# Patient Record
Sex: Female | Born: 1960 | Race: Black or African American | Hispanic: No | Marital: Married | Smoking: Current every day smoker
Health system: Southern US, Community
[De-identification: ages and names within clinical notes are randomized; demographics above are authoritative.]

## PROBLEM LIST (undated history)

## (undated) DIAGNOSIS — R569 Unspecified convulsions: Secondary | ICD-10-CM

## (undated) DIAGNOSIS — R159 Full incontinence of feces: Secondary | ICD-10-CM

## (undated) DIAGNOSIS — I639 Cerebral infarction, unspecified: Secondary | ICD-10-CM

## (undated) DIAGNOSIS — H269 Unspecified cataract: Secondary | ICD-10-CM

## (undated) DIAGNOSIS — I10 Essential (primary) hypertension: Secondary | ICD-10-CM

## (undated) DIAGNOSIS — R32 Unspecified urinary incontinence: Secondary | ICD-10-CM

## (undated) DIAGNOSIS — G35 Multiple sclerosis: Secondary | ICD-10-CM

## (undated) DIAGNOSIS — F32A Depression, unspecified: Secondary | ICD-10-CM

## (undated) DIAGNOSIS — F329 Major depressive disorder, single episode, unspecified: Secondary | ICD-10-CM

## (undated) DIAGNOSIS — E7800 Pure hypercholesterolemia, unspecified: Secondary | ICD-10-CM

## (undated) HISTORY — DX: Unspecified cataract: H26.9

## (undated) HISTORY — PX: ECTOPIC PREGNANCY SURGERY: SHX613

## (undated) HISTORY — PX: FOOT FRACTURE SURGERY: SHX645

---

## 2002-10-13 ENCOUNTER — Ambulatory Visit (HOSPITAL_COMMUNITY): Admission: RE | Admit: 2002-10-13 | Discharge: 2002-10-13 | Payer: Self-pay | Admitting: General Surgery

## 2002-10-13 ENCOUNTER — Encounter: Payer: Self-pay | Admitting: General Surgery

## 2005-04-14 ENCOUNTER — Ambulatory Visit: Payer: Self-pay | Admitting: Family Medicine

## 2005-05-01 ENCOUNTER — Ambulatory Visit: Payer: Self-pay | Admitting: Family Medicine

## 2005-10-22 ENCOUNTER — Ambulatory Visit: Payer: Self-pay | Admitting: Family Medicine

## 2005-10-22 ENCOUNTER — Other Ambulatory Visit: Admission: RE | Admit: 2005-10-22 | Discharge: 2005-10-22 | Payer: Self-pay | Admitting: Family Medicine

## 2005-10-22 ENCOUNTER — Ambulatory Visit: Admission: AD | Admit: 2005-10-22 | Discharge: 2005-10-22 | Payer: Self-pay | Admitting: Family Medicine

## 2005-10-22 ENCOUNTER — Encounter (INDEPENDENT_AMBULATORY_CARE_PROVIDER_SITE_OTHER): Payer: Self-pay | Admitting: Specialist

## 2008-05-16 ENCOUNTER — Ambulatory Visit (HOSPITAL_COMMUNITY): Admission: RE | Admit: 2008-05-16 | Discharge: 2008-05-16 | Payer: Self-pay | Admitting: Internal Medicine

## 2008-09-11 ENCOUNTER — Emergency Department (HOSPITAL_COMMUNITY): Admission: EM | Admit: 2008-09-11 | Discharge: 2008-09-11 | Payer: Self-pay | Admitting: Emergency Medicine

## 2009-02-04 ENCOUNTER — Observation Stay (HOSPITAL_COMMUNITY): Admission: EM | Admit: 2009-02-04 | Discharge: 2009-02-05 | Payer: Self-pay | Admitting: Emergency Medicine

## 2009-02-04 ENCOUNTER — Ambulatory Visit: Payer: Self-pay | Admitting: Cardiology

## 2009-02-05 ENCOUNTER — Encounter: Payer: Self-pay | Admitting: Cardiology

## 2009-11-19 ENCOUNTER — Other Ambulatory Visit: Admission: RE | Admit: 2009-11-19 | Discharge: 2009-11-19 | Payer: Self-pay | Admitting: Internal Medicine

## 2010-02-17 ENCOUNTER — Ambulatory Visit (HOSPITAL_COMMUNITY): Admission: RE | Admit: 2010-02-17 | Discharge: 2010-02-17 | Payer: Self-pay | Admitting: Gastroenterology

## 2010-09-05 LAB — BASIC METABOLIC PANEL
CO2: 26 mEq/L (ref 19–32)
Chloride: 107 mEq/L (ref 96–112)
Creatinine, Ser: 0.53 mg/dL (ref 0.4–1.2)
GFR calc Af Amer: >60 mL/min (ref >60)
Glucose, Bld: 97 mg/dL (ref 70–99)
Sodium: 137 mEq/L (ref 135–145)

## 2010-09-05 LAB — CBC
Hemoglobin: 13.8 g/dL (ref 12.0–15.0)
MCHC: 34.1 g/dL (ref 30.0–36.0)
MCV: 93.9 fL (ref 78.0–100.0)
RBC: 4.32 MIL/uL (ref 3.87–5.11)
RDW: 13.8 % (ref 11.5–15.5)

## 2010-09-05 LAB — CARDIAC PANEL(CRET KIN+CKTOT+MB+TROPI)
Relative Index: INVALID (ref 0.0–2.5)
Relative Index: INVALID (ref 0.0–2.5)
Total CK: 53 U/L (ref 7–177)
Total CK: 71 U/L (ref 7–177)
Troponin I: 0.05 ng/mL (ref 0.00–0.06)
Troponin I: 0.06 ng/mL (ref 0.00–0.06)

## 2010-09-05 LAB — LIPID PANEL
Total CHOL/HDL Ratio: 3.6 RATIO
VLDL: 12 mg/dL (ref 0–40)

## 2010-09-05 LAB — HEPATIC FUNCTION PANEL
AST: 12 U/L (ref 0–37)
Bilirubin, Direct: <0.1 mg/dL (ref 0.0–0.3)
Total Bilirubin: 0.5 mg/dL (ref 0.3–1.2)

## 2010-09-05 LAB — POCT CARDIAC MARKERS
CKMB, poc: <1 ng/mL — ABNORMAL LOW (ref 1.0–8.0)
Myoglobin, poc: 25.2 ng/mL (ref 12–200)
Troponin i, poc: <0.05 ng/mL (ref 0.00–0.09)

## 2010-09-05 LAB — DIFFERENTIAL
Basophils Absolute: 0.1 10*3/uL (ref 0.0–0.1)
Basophils Relative: 1 % (ref 0–1)
Eosinophils Absolute: 0.2 10*3/uL (ref 0.0–0.7)
Monocytes Absolute: 0.4 10*3/uL (ref 0.1–1.0)
Monocytes Relative: 4 % (ref 3–12)

## 2010-10-17 NOTE — Op Note (Signed)
NAME:  Christy Joseph, Christy Joseph                ACCOUNT NO.:  [id number]   MEDICAL RECORD NO.:  [id number]          PATIENT TYPE:  AMB   LOCATION:  DFTL                          FACILITY:  [facility]   PHYSICIAN:  Tanya S. Shawnie Pons, M.D.   DATE OF BIRTH:  08/11/1960   DATE OF PROCEDURE:  10/22/2005  DATE OF DISCHARGE:                                 OPERATIVE REPORT   PREOPERATIVE DIAGNOSIS:  Ruptured ectopic.   POSTOPERATIVE DIAGNOSIS:  Ruptured ectopic.   PROCEDURE:  Laparoscopic right partial salpingectomy.   SURGEON:  Shelbie Proctor. Shawnie Pons, M.D.   ANESTHESIA:  General endotracheal tube and local.   ANESTHESIOLOGIST:  Germaine Pomfret, M.D.   FINDINGS:  One liter of blood in the abdomen, ruptured bleeding right tube.   ESTIMATED BLOOD LOSS:  350 mL plus 1 L already in the abdomen.   COMPLICATIONS:  None.   SPECIMENS:  Right tube and contents to Pathology.   REASON FOR PROCEDURE:  The patient is a 50 year old gravida 5, para 0, who  has had 2 previous ectopics, who was seen in Dr. Nicholos Johns Rice's office  today with bleeding and lower abdominal pain.  The pain started  approximately 3 a.m.  She came into the [facility] and was found to  have a 6-week 1-day living right ectopic with complex free fluid in the  abdomen consistent with hemorrhage; she was taken to the operating room.   DESCRIPTION OF PROCEDURE:  The patient was taken to the OR, where she was  placed in the dorsal lithotomy and Allen stirrups.  Once anesthesia was  induced, she was prepped and draped in the usual sterile fashion.  A  speculum was then placed inside the vagina.  The cervix was visualized and  grasped on the anterior lip with a single-tooth tenaculum and a Hulka  tenaculum placed through the cervix.  A red rubber catheter was used to  drain the bladder.  Attention was then turned to the abdomen.  Four  milliliters of 0.25% Marcaine were then injected in the umbilicus.  A knife  was used to make a vertical incision  in the umbilicus.  A hemostat was used  to enter the peritoneal cavity.  Two 0 Vicryls on a UR6s were used to tag  the outer side of the fascia and a Hasson trocar was placed through the  incision.  Pneumoperitoneum was created.  There was immediately blood noted  in the pelvis.  The uterus was lifted up.  It appeared to be in the right  side; the left tube appeared normal.  Right and left lower quadrant 5-mm  ports were then placed under direct visualization through old scars.  The  tube was then grasped and the distal portion of the tube was taken off using  the Gyrus with excellent hemostasis noted.  The 10-mm camera was then  changed to a 5-mm and an Endobag was used to collect the specimen.  The  specimen was brought out through the umbilicus and the port was removed.  Once the specimen was out, the camera was changed back to the  10-mm and the  ports were reintroduced.  A Nezhat was then used to clear the abdomen of  most of the blood that was within it.  Some fluid was left in the abdominal  cavity to help break up whatever clot could not be removed.  All instruments  were then removed under direct visualization.  The ports were removed.  There appeared to be no significant bleeding.  The umbilicus was closed with  2 figures-of-eight, completing the previous-mentioned Vicryls on UR6.  The  skin was closed with a 3-0 Vicryl in a running subcuticular fashion.  The  two 5-mm ports were closed with a running subcuticular stitch of 3-0 Vicryl.  The Hulka tenaculum was then removed from the vagina.  There appeared to be  no significant bleeding.  The patient was taken out of dorsal lithotomy and  was stable  All instrument, needle and lap counts were correct x2.  The  patient was taken to Recovery in good condition.           ______________________________  Shelbie Proctor Shawnie Pons, M.D.     TSP/MEDQ  D:  10/22/2005  T:  10/23/2005  Job:  [id number]

## 2010-11-10 ENCOUNTER — Emergency Department (HOSPITAL_COMMUNITY)
Admission: EM | Admit: 2010-11-10 | Discharge: 2010-11-10 | Disposition: A | Discharge status: Home / Self Care | Payer: Self-pay | Attending: Emergency Medicine | Admitting: Emergency Medicine

## 2010-11-10 DIAGNOSIS — I10 Essential (primary) hypertension: Secondary | ICD-10-CM | POA: Insufficient documentation

## 2010-11-10 DIAGNOSIS — IMO0002 Reserved for concepts with insufficient information to code with codable children: Secondary | ICD-10-CM | POA: Insufficient documentation

## 2010-11-10 DIAGNOSIS — F17200 Nicotine dependence, unspecified, uncomplicated: Secondary | ICD-10-CM | POA: Insufficient documentation

## 2010-11-10 DIAGNOSIS — X58XXXA Exposure to other specified factors, initial encounter: Secondary | ICD-10-CM | POA: Insufficient documentation

## 2010-11-10 LAB — CK: Total CK: 60 U/L (ref 7–177)

## 2010-11-10 LAB — BASIC METABOLIC PANEL WITH GFR
CO2: 26 meq/L (ref 19–32)
Calcium: 10.8 mg/dL — ABNORMAL HIGH (ref 8.4–10.5)
Chloride: 98 meq/L (ref 96–112)
Creatinine, Ser: 0.69 mg/dL (ref 0.4–1.2)
Glucose, Bld: 119 mg/dL — ABNORMAL HIGH (ref 70–99)
Sodium: 134 meq/L — ABNORMAL LOW (ref 135–145)

## 2010-11-10 LAB — BASIC METABOLIC PANEL
BUN: 20 mg/dL (ref 6–23)
GFR calc Af Amer: >60 mL/min (ref >60)
GFR calc non Af Amer: >60 mL/min (ref >60)
Potassium: 3.6 mEq/L (ref 3.5–5.1)

## 2012-01-08 ENCOUNTER — Encounter (HOSPITAL_COMMUNITY): Payer: Self-pay | Admitting: Emergency Medicine

## 2012-01-08 ENCOUNTER — Emergency Department (HOSPITAL_COMMUNITY): Payer: Self-pay

## 2012-01-08 ENCOUNTER — Emergency Department (HOSPITAL_COMMUNITY)
Admission: EM | Admit: 2012-01-08 | Discharge: 2012-01-08 | Disposition: A | Discharge status: Home / Self Care | Payer: Self-pay | Attending: Emergency Medicine | Admitting: Emergency Medicine

## 2012-01-08 DIAGNOSIS — I10 Essential (primary) hypertension: Secondary | ICD-10-CM | POA: Insufficient documentation

## 2012-01-08 DIAGNOSIS — G35 Multiple sclerosis: Secondary | ICD-10-CM | POA: Insufficient documentation

## 2012-01-08 DIAGNOSIS — R4182 Altered mental status, unspecified: Secondary | ICD-10-CM | POA: Insufficient documentation

## 2012-01-08 DIAGNOSIS — F17200 Nicotine dependence, unspecified, uncomplicated: Secondary | ICD-10-CM | POA: Insufficient documentation

## 2012-01-08 HISTORY — DX: Essential (primary) hypertension: I10

## 2012-01-08 HISTORY — DX: Multiple sclerosis: G35

## 2012-01-08 LAB — RAPID URINE DRUG SCREEN, HOSP PERFORMED
Cocaine: NOT DETECTED
Opiates: NOT DETECTED

## 2012-01-08 LAB — BASIC METABOLIC PANEL
GFR calc Af Amer: >90 mL/min (ref >90)
GFR calc non Af Amer: >90 mL/min (ref >90)
Potassium: 3.9 mEq/L (ref 3.5–5.1)
Sodium: 139 mEq/L (ref 135–145)

## 2012-01-08 LAB — URINE MICROSCOPIC-ADD ON

## 2012-01-08 LAB — URINALYSIS, ROUTINE W REFLEX MICROSCOPIC
Nitrite: NEGATIVE
Specific Gravity, Urine: 1.03 (ref 1.005–1.030)
Urobilinogen, UA: 0.2 mg/dL (ref 0.0–1.0)

## 2012-01-08 LAB — CBC WITH DIFFERENTIAL/PLATELET
Basophils Relative: 0 % (ref 0–1)
Eosinophils Absolute: 0.1 10*3/uL (ref 0.0–0.7)
Lymphs Abs: 2.3 10*3/uL (ref 0.7–4.0)
MCH: 31.8 pg (ref 26.0–34.0)
MCHC: 34.5 g/dL (ref 30.0–36.0)
Neutrophils Relative %: 67 % (ref 43–77)
Platelets: 264 10*3/uL (ref 150–400)
RBC: 4.43 MIL/uL (ref 3.87–5.11)

## 2012-01-08 MED ORDER — ACETAMINOPHEN 500 MG PO TABS
1000.0000 mg | ORAL_TABLET | Freq: Once | ORAL | Status: AC
  Administered 2012-01-08: 1000 mg via ORAL
  Filled 2012-01-08: qty 2

## 2012-01-08 NOTE — ED Provider Notes (Signed)
History     CSN: [id number]  Arrival date & time 01/08/12  4401   First MD Initiated Contact with Patient 01/08/12 [phone]      Chief Complaint  Patient presents with  . Altered Mental Status    (Consider location/radiation/quality/duration/timing/severity/associated sxs/prior treatment) HPI Comments: Pt's husband states she was lying in bed "shaking".  He tried calling her name and shaking her "but she wouldn't wake up".  Her eyes were open and she was drooling.  ? Seizure.  No prior h/o seizures.  She has MS and her MD in [place] started her on "shots" about 2 weeks ago.  She has no local PCP.  She did not bite her tongue and she was not incontinent.  At exam time she c/o diffuse headache but nothing else.  She and her husband feel that she is acting normally.  She has had no head trauma.  No fever or chills.  Denies drugs or alcohol.  She is not diabetic.  When she awakened today she wondered "who are all these people around me"?  The history is provided by the patient, the spouse and the EMS personnel. No language interpreter was used.    Past Medical History  Diagnosis Date  . Multiple sclerosis   . Hypertension     History reviewed. No pertinent past surgical history.  No family history on file.  History  Substance Use Topics  . Smoking status: Current Everyday Smoker -- 1.0 packs/day  . Smokeless tobacco: Not on file  . Alcohol Use: No    OB History    Grav Para Term Preterm Abortions TAB SAB Ect Mult Living                  Review of Systems  Constitutional: Negative for fever, chills and diaphoresis.  HENT: Negative for drooling and neck pain.   Eyes: Negative for visual disturbance.  Respiratory: Negative for chest tightness and shortness of breath.   Cardiovascular: Negative for chest pain.  Gastrointestinal: Negative for nausea, vomiting and diarrhea.  Genitourinary: Negative for dysuria, urgency, frequency, hematuria and difficulty urinating.  Skin:  Negative for color change and pallor.  Neurological: Positive for headaches. Negative for dizziness, syncope, facial asymmetry, weakness and light-headedness.  Psychiatric/Behavioral: Negative for confusion.  All other systems reviewed and are negative.    Allergies  Review of patient's allergies indicates no known allergies.  Home Medications   Current Outpatient Rx  Name Route Sig Dispense Refill  . GLATIRAMER ACETATE 20 MG/ML [place] KIT Subcutaneous Inject 20 mg into the skin daily.    Marland Kitchen LISINOPRIL 20 MG PO TABS Oral Take 20 mg by mouth every morning.    Marland Kitchen SERTRALINE HCL 50 MG PO TABS Oral Take 50 mg by mouth every morning.      BP 144/96  Pulse 55  Temp 98.1 F (36.7 C) (Oral)  Resp 16  Ht 5\' 4"  (1.626 m)  Wt 110 lb (49.896 kg)  BMI 18.88 kg/m2  SpO2 100%  Physical Exam  Nursing note and vitals reviewed. Constitutional: She is oriented to person, place, and time. She appears well-developed and well-nourished. She is cooperative.  Non-toxic appearance. She does not have a sickly appearance. She does not appear ill. No distress.  HENT:  Head: Normocephalic and atraumatic.  Eyes: EOM are normal. Pupils are equal, round, and reactive to light.  Neck: Normal range of motion.  Cardiovascular: Normal rate, regular rhythm, normal heart sounds and intact distal pulses.   Pulmonary/Chest: Effort  normal and breath sounds normal. No respiratory distress. She has no wheezes.  Abdominal: Soft. She exhibits no distension. There is no tenderness.  Musculoskeletal: Normal range of motion.  Neurological: She is alert and oriented to person, place, and time. She has normal strength. She displays no tremor. No cranial nerve deficit or sensory deficit. She displays a negative Romberg sign. She displays no seizure activity. Coordination normal. GCS eye subscore is 4. GCS verbal subscore is 5. GCS motor subscore is 6.       Pt is alert and oriented.  Skin: Skin is warm and dry. She is not  diaphoretic.  Psychiatric: She has a normal mood and affect. Judgment normal.    ED Course  Procedures (including critical care time)  Labs Reviewed  URINALYSIS, ROUTINE W REFLEX MICROSCOPIC - Abnormal; Notable for the following:    Hgb urine dipstick SMALL (*)     Protein, ur 30 (*)     Leukocytes, UA SMALL (*)     All other components within normal limits  URINE RAPID DRUG SCREEN (HOSP PERFORMED) - Abnormal; Notable for the following:    Tetrahydrocannabinol POSITIVE (*)     All other components within normal limits  URINE MICROSCOPIC-ADD ON - Abnormal; Notable for the following:    Squamous Epithelial / LPF FEW (*)     Bacteria, UA MANY (*)     All other components within normal limits  CBC WITH DIFFERENTIAL  BASIC METABOLIC PANEL  ETHANOL   Ct Head Wo Contrast  01/08/2012  *RADIOLOGY REPORT*  Clinical Data: Altered mental status.  Confusion.  CT HEAD WITHOUT CONTRAST  Technique:  Contiguous axial images were obtained from the base of the skull through the vertex without contrast.  Comparison: None.  Findings: No mass lesion, mass effect, midline shift, hydrocephalus, hemorrhage.  No territorial ischemia or acute infarction.  Small areas of low attenuation are present in the parietal lobes bilaterally adjacent to the atrium of the lateral ventricles, likely representing chronic ischemic change rather than small lacunar infarcts.  Intracranial atherosclerosis is noted. The mastoid air cells and visualized paranasal sinuses clear.  IMPRESSION:  1.  No acute intracranial abnormality. 2.  Small low attenuation areas in the parietal lobes likely represent chronic ischemic change.  Original Report Authenticated By: Andreas Newport, M.D.    Date: 01/08/2012  Rate: 55  Rhythm: sinus bradycardia  QRS Axis: normal  Intervals: normal  ST/T Wave abnormalities: normal  Conduction Disutrbances:none  Narrative Interpretation:   Old EKG Reviewed: unchanged    1. Altered mental status        MDM  Call your MD in [place] for appt.  ? Seizure.        Evalina Field, [place] 01/08/12 [phone]

## 2012-01-08 NOTE — ED Provider Notes (Signed)
Medical screening examination/treatment/procedure(s) were conducted as a shared visit with non-physician practitioner(s) and myself.  I personally evaluated the patient during the encounter.  Normal mentation and normal physical exam in ED. CT scan negative. Patient alert at discharge  Donnetta Hutching, MD 01/08/12 1400

## 2012-01-08 NOTE — ED Notes (Addendum)
Per ems-pt husband reports confusion upon waking this am. [id number]. Unsteady gait. Pt husband states pt was "shaking in the bed and she wouldn't wake up". No loss of bowel/bladder. Pt A and O x 4, neuro intact upon arrival to ed. Nad noted.

## 2012-02-16 ENCOUNTER — Other Ambulatory Visit (HOSPITAL_COMMUNITY): Payer: Self-pay | Admitting: Nurse Practitioner

## 2012-02-16 DIAGNOSIS — Z139 Encounter for screening, unspecified: Secondary | ICD-10-CM

## 2012-02-22 ENCOUNTER — Ambulatory Visit (HOSPITAL_COMMUNITY)
Admission: RE | Admit: 2012-02-22 | Discharge: 2012-02-22 | Disposition: A | Discharge status: Home / Self Care | Payer: PRIVATE HEALTH INSURANCE | Source: Ambulatory Visit | Attending: Family Medicine | Admitting: Family Medicine

## 2012-02-22 DIAGNOSIS — Z1231 Encounter for screening mammogram for malignant neoplasm of breast: Secondary | ICD-10-CM | POA: Insufficient documentation

## 2012-02-22 DIAGNOSIS — Z139 Encounter for screening, unspecified: Secondary | ICD-10-CM

## 2014-06-01 DIAGNOSIS — H269 Unspecified cataract: Secondary | ICD-10-CM

## 2014-06-01 HISTORY — DX: Unspecified cataract: H26.9

## 2014-06-01 HISTORY — PX: EYE SURGERY: SHX253

## 2014-12-19 ENCOUNTER — Other Ambulatory Visit: Payer: Self-pay

## 2014-12-19 ENCOUNTER — Encounter (HOSPITAL_COMMUNITY): Payer: Self-pay

## 2014-12-19 ENCOUNTER — Encounter (HOSPITAL_COMMUNITY)
Admission: RE | Admit: 2014-12-19 | Discharge: 2014-12-19 | Disposition: A | Discharge status: Home / Self Care | Payer: Medicaid Other | Source: Ambulatory Visit | Attending: Ophthalmology | Admitting: Ophthalmology

## 2014-12-19 DIAGNOSIS — Z01818 Encounter for other preprocedural examination: Secondary | ICD-10-CM | POA: Insufficient documentation

## 2014-12-19 HISTORY — DX: Major depressive disorder, single episode, unspecified: F32.9

## 2014-12-19 HISTORY — DX: Cerebral infarction, unspecified: I63.9

## 2014-12-19 HISTORY — DX: Full incontinence of feces: R15.9

## 2014-12-19 HISTORY — DX: Unspecified convulsions: R56.9

## 2014-12-19 HISTORY — DX: Unspecified urinary incontinence: R32

## 2014-12-19 HISTORY — DX: Depression, unspecified: F32.A

## 2014-12-19 HISTORY — DX: Pure hypercholesterolemia, unspecified: E78.00

## 2014-12-19 LAB — BASIC METABOLIC PANEL
Anion gap: 9 (ref 5–15)
BUN: 18 mg/dL (ref 6–20)
CALCIUM: 9.9 mg/dL (ref 8.9–10.3)
CHLORIDE: 106 mmol/L (ref 101–111)
CO2: 26 mmol/L (ref 22–32)
Creatinine, Ser: 0.94 mg/dL (ref 0.44–1.00)
GFR calc Af Amer: >60 mL/min (ref >60)
GLUCOSE: 90 mg/dL (ref 65–99)
Potassium: 4.3 mmol/L (ref 3.5–5.1)
Sodium: 141 mmol/L (ref 135–145)

## 2014-12-19 LAB — CBC WITH DIFFERENTIAL/PLATELET
BASOS ABS: 0 10*3/uL (ref 0.0–0.1)
BASOS PCT: 0 % (ref 0–1)
EOS PCT: 2 % (ref 0–5)
Eosinophils Absolute: 0.2 10*3/uL (ref 0.0–0.7)
HEMATOCRIT: 36.9 % (ref 36.0–46.0)
Hemoglobin: 12.4 g/dL (ref 12.0–15.0)
LYMPHS PCT: 38 % (ref 12–46)
Lymphs Abs: 3 10*3/uL (ref 0.7–4.0)
MCH: 31.9 pg (ref 26.0–34.0)
MCHC: 33.6 g/dL (ref 30.0–36.0)
MCV: 94.9 fL (ref 78.0–100.0)
MONO ABS: 0.3 10*3/uL (ref 0.1–1.0)
MONOS PCT: 4 % (ref 3–12)
NEUTROS ABS: 4.3 10*3/uL (ref 1.7–7.7)
NEUTROS PCT: 56 % (ref 43–77)
Platelets: 287 10*3/uL (ref 150–400)
RBC: 3.89 MIL/uL (ref 3.87–5.11)
RDW: 14.4 % (ref 11.5–15.5)
WBC: 7.7 10*3/uL (ref 4.0–10.5)

## 2014-12-19 NOTE — Pre-Procedure Instructions (Signed)
Patient assisted by sister, Cassell Clement. Christy Joseph asks [place] to contact her for any correspondence concerning change in therapy for patient. [phone].

## 2014-12-19 NOTE — Patient Instructions (Addendum)
Your procedure is scheduled on: 12/27/2014  Report to [facility] at  615    AM.  Call this number if you have problems the morning of surgery: [phone]   Do not eat food or drink liquids :After Midnight.      Take these medicines the morning of surgery with A SIP OF WATER: lisinopril, zoloft   Do not wear jewelry, make-up or nail polish.  Do not wear lotions, powders, or perfumes. You may wear deodorant.  Do not shave 48 hours prior to surgery.  Do not bring valuables to the hospital.  Contacts, dentures or bridgework may not be worn into surgery.  Leave suitcase in the car. After surgery it may be brought to your room.  For patients admitted to the hospital, checkout time is 11:00 AM the day of discharge.   Patients discharged the day of surgery will not be allowed to drive home.  :     Please read over the following fact sheets that you were given: Coughing and Deep Breathing, Surgical Site Infection Prevention, Anesthesia Post-op Instructions and Care and Recovery After Surgery    Cataract A cataract is a clouding of the lens of the eye. When a lens becomes cloudy, vision is reduced based on the degree and nature of the clouding. Many cataracts reduce vision to some degree. Some cataracts make people more near-sighted as they develop. Other cataracts increase glare. Cataracts that are ignored and become worse can sometimes look white. The white color can be seen through the pupil. CAUSES   Aging. However, cataracts may occur at any age, even in newborns.   Certain drugs.   Trauma to the eye.   Certain diseases such as diabetes.   Specific eye diseases such as chronic inflammation inside the eye or a sudden attack of a rare form of glaucoma.   Inherited or acquired medical problems.  SYMPTOMS   Gradual, progressive drop in vision in the affected eye.   Severe, rapid visual loss. This most often happens when trauma is the cause.  DIAGNOSIS  To detect a cataract, an eye  doctor examines the lens. Cataracts are best diagnosed with an exam of the eyes with the pupils enlarged (dilated) by drops.  TREATMENT  For an early cataract, vision may improve by using different eyeglasses or stronger lighting. If that does not help your vision, surgery is the only effective treatment. A cataract needs to be surgically removed when vision loss interferes with your everyday activities, such as driving, reading, or watching TV. A cataract may also have to be removed if it prevents examination or treatment of another eye problem. Surgery removes the cloudy lens and usually replaces it with a substitute lens (intraocular lens, IOL).  At a time when both you and your doctor agree, the cataract will be surgically removed. If you have cataracts in both eyes, only one is usually removed at a time. This allows the operated eye to heal and be out of danger from any possible problems after surgery (such as infection or poor wound healing). In rare cases, a cataract may be doing damage to your eye. In these cases, your caregiver may advise surgical removal right away. The vast majority of people who have cataract surgery have better vision afterward. HOME CARE INSTRUCTIONS  If you are not planning surgery, you may be asked to do the following:  Use different eyeglasses.   Use stronger or brighter lighting.   Ask your eye doctor about reducing your  medicine dose or changing medicines if it is thought that a medicine caused your cataract. Changing medicines does not make the cataract go away on its own.   Become familiar with your surroundings. Poor vision can lead to injury. Avoid bumping into things on the affected side. You are at a higher risk for tripping or falling.   Exercise extreme care when driving or operating machinery.   Wear sunglasses if you are sensitive to bright light or experiencing problems with glare.  SEEK IMMEDIATE MEDICAL CARE IF:   You have a worsening or sudden  vision loss.   You notice redness, swelling, or increasing pain in the eye.   You have a fever.  Document Released: 05/18/2005 Document Revised: 05/07/2011 Document Reviewed: 01/09/2011 [facility] Patient Information 2012 [place].PATIENT INSTRUCTIONS POST-ANESTHESIA  IMMEDIATELY FOLLOWING SURGERY:  Do not drive or operate machinery for the first twenty four hours after surgery.  Do not make any important decisions for twenty four hours after surgery or while taking narcotic pain medications or sedatives.  If you develop intractable nausea and vomiting or a severe headache please notify your doctor immediately.  FOLLOW-UP:  Please make an appointment with your surgeon as instructed. You do not need to follow up with anesthesia unless specifically instructed to do so.  WOUND CARE INSTRUCTIONS (if applicable):  Keep a dry clean dressing on the anesthesia/puncture wound site if there is drainage.  Once the wound has quit draining you may leave it open to air.  Generally you should leave the bandage intact for twenty four hours unless there is drainage.  If the epidural site drains for more than 36-48 hours please call the anesthesia department.  QUESTIONS?:  Please feel free to call your physician or the hospital operator if you have any questions, and they will be happy to assist you.

## 2014-12-19 NOTE — Pre-Procedure Instructions (Signed)
Patient given information to sign up for my chart at home. 

## 2014-12-27 ENCOUNTER — Ambulatory Visit (HOSPITAL_COMMUNITY): Payer: Medicaid Other | Admitting: Anesthesiology

## 2014-12-27 ENCOUNTER — Encounter (HOSPITAL_COMMUNITY)
Admission: RE | Disposition: A | Discharge status: Home / Self Care | Payer: Self-pay | Source: Ambulatory Visit | Attending: Ophthalmology

## 2014-12-27 ENCOUNTER — Ambulatory Visit (HOSPITAL_COMMUNITY)
Admission: RE | Admit: 2014-12-27 | Discharge: 2014-12-27 | Disposition: A | Discharge status: Home / Self Care | Payer: Medicaid Other | Source: Ambulatory Visit | Attending: Ophthalmology | Admitting: Ophthalmology

## 2014-12-27 ENCOUNTER — Encounter (HOSPITAL_COMMUNITY): Payer: Self-pay | Admitting: *Deleted

## 2014-12-27 DIAGNOSIS — I10 Essential (primary) hypertension: Secondary | ICD-10-CM | POA: Diagnosis not present

## 2014-12-27 DIAGNOSIS — Z8673 Personal history of transient ischemic attack (TIA), and cerebral infarction without residual deficits: Secondary | ICD-10-CM | POA: Diagnosis not present

## 2014-12-27 DIAGNOSIS — Z79899 Other long term (current) drug therapy: Secondary | ICD-10-CM | POA: Diagnosis not present

## 2014-12-27 DIAGNOSIS — F17200 Nicotine dependence, unspecified, uncomplicated: Secondary | ICD-10-CM | POA: Insufficient documentation

## 2014-12-27 DIAGNOSIS — G35 Multiple sclerosis: Secondary | ICD-10-CM | POA: Insufficient documentation

## 2014-12-27 DIAGNOSIS — H269 Unspecified cataract: Secondary | ICD-10-CM | POA: Insufficient documentation

## 2014-12-27 DIAGNOSIS — H21541 Posterior synechiae (iris), right eye: Secondary | ICD-10-CM | POA: Insufficient documentation

## 2014-12-27 HISTORY — PX: CATARACT EXTRACTION W/PHACO: SHX586

## 2014-12-27 SURGERY — PHACOEMULSIFICATION, CATARACT, WITH IOL INSERTION
Anesthesia: Monitor Anesthesia Care | Site: Eye | Laterality: Right

## 2014-12-27 MED ORDER — PHENYLEPHRINE HCL 2.5 % OP SOLN
OPHTHALMIC | Status: AC
  Filled 2014-12-27: qty 15

## 2014-12-27 MED ORDER — FENTANYL CITRATE (PF) 100 MCG/2ML IJ SOLN
INTRAMUSCULAR | Status: AC
  Filled 2014-12-27: qty 2

## 2014-12-27 MED ORDER — LIDOCAINE HCL (PF) 1 % IJ SOLN
INTRAMUSCULAR | Status: AC
  Filled 2014-12-27: qty 2

## 2014-12-27 MED ORDER — EPINEPHRINE HCL 1 MG/ML IJ SOLN
INTRAMUSCULAR | Status: AC
  Filled 2014-12-27: qty 1

## 2014-12-27 MED ORDER — TETRACAINE HCL 0.5 % OP SOLN
1.0000 [drp] | OPHTHALMIC | Status: AC
Start: 2014-12-27 — End: 2014-12-27
  Administered 2014-12-27 (×3): 1 [drp] via OPHTHALMIC
  Filled 2014-12-27: qty 2

## 2014-12-27 MED ORDER — BSS IO SOLN
INTRAOCULAR | Status: DC | PRN
  Administered 2014-12-27 (×2): 15 mL

## 2014-12-27 MED ORDER — TRYPAN BLUE 0.06 % OP SOLN
OPHTHALMIC | Status: AC
  Filled 2014-12-27: qty 0.5

## 2014-12-27 MED ORDER — PHENYLEPHRINE HCL 2.5 % OP SOLN
1.0000 [drp] | OPHTHALMIC | Status: AC
Start: 2014-12-27 — End: 2014-12-27
  Administered 2014-12-27 (×3): 1 [drp] via OPHTHALMIC

## 2014-12-27 MED ORDER — MIDAZOLAM HCL 2 MG/2ML IJ SOLN
1.0000 mg | INTRAMUSCULAR | Status: DC | PRN
  Administered 2014-12-27: 2 mg via INTRAVENOUS

## 2014-12-27 MED ORDER — MIDAZOLAM HCL 2 MG/2ML IJ SOLN
INTRAMUSCULAR | Status: AC
  Filled 2014-12-27: qty 2

## 2014-12-27 MED ORDER — LIDOCAINE HCL 3.5 % OP GEL
1.0000 "application " | Freq: Once | OPHTHALMIC | Status: AC
  Administered 2014-12-27: 1 via OPHTHALMIC
  Filled 2014-12-27: qty 1

## 2014-12-27 MED ORDER — CYCLOPENTOLATE-PHENYLEPHRINE 0.2-1 % OP SOLN
1.0000 [drp] | OPHTHALMIC | Status: AC
  Administered 2014-12-27 (×3): 1 [drp] via OPHTHALMIC

## 2014-12-27 MED ORDER — PROVISC 10 MG/ML IO SOLN
INTRAOCULAR | Status: DC | PRN
  Administered 2014-12-27: .85 mL via INTRAOCULAR

## 2014-12-27 MED ORDER — NEOMYCIN-POLYMYXIN-DEXAMETH 3.5-10000-0.1 OP SUSP
OPHTHALMIC | Status: AC
  Filled 2014-12-27: qty 5

## 2014-12-27 MED ORDER — POVIDONE-IODINE 5 % OP SOLN
OPHTHALMIC | Status: DC | PRN
  Administered 2014-12-27: 1 via OPHTHALMIC

## 2014-12-27 MED ORDER — CYCLOPENTOLATE-PHENYLEPHRINE OP SOLN OPTIME - NO CHARGE
OPHTHALMIC | Status: AC
Start: 2014-12-27 — End: 2014-12-27
  Filled 2014-12-27: qty 2

## 2014-12-27 MED ORDER — EPINEPHRINE HCL 1 MG/ML IJ SOLN
INTRAOCULAR | Status: DC | PRN
  Administered 2014-12-27: 500 mL

## 2014-12-27 MED ORDER — SODIUM HYALURONATE 10 MG/ML IO SOLN
INTRAOCULAR | Status: DC | PRN
  Administered 2014-12-27: 0.85 mL via INTRAOCULAR

## 2014-12-27 MED ORDER — LACTATED RINGERS IV SOLN
INTRAVENOUS | Status: DC
Start: 2014-12-27 — End: 2014-12-27
  Administered 2014-12-27: 1000 mL via INTRAVENOUS

## 2014-12-27 MED ORDER — FENTANYL CITRATE (PF) 100 MCG/2ML IJ SOLN
25.0000 ug | INTRAMUSCULAR | Status: AC
  Administered 2014-12-27 (×2): 25 ug via INTRAVENOUS

## 2014-12-27 MED ORDER — GLYCOPYRROLATE 0.2 MG/ML IJ SOLN
INTRAMUSCULAR | Status: DC | PRN
  Administered 2014-12-27: 0.2 mg via INTRAVENOUS

## 2014-12-27 MED ORDER — LIDOCAINE HCL (PF) 1 % IJ SOLN
INTRAOCULAR | Status: DC | PRN
  Administered 2014-12-27: 1 mL via OPHTHALMIC

## 2014-12-27 MED ORDER — TRYPAN BLUE 0.06 % OP SOLN
OPHTHALMIC | Status: DC | PRN
  Administered 2014-12-27: .25 mL via INTRAOCULAR

## 2014-12-27 MED ORDER — GLYCOPYRROLATE 0.2 MG/ML IJ SOLN
INTRAMUSCULAR | Status: AC
Start: 2014-12-27 — End: 2014-12-27
  Filled 2014-12-27: qty 1

## 2014-12-27 SURGICAL SUPPLY — 19 items
10-0 ETHILON SUTURE ×2 IMPLANT
CLOTH BEACON ORANGE TIMEOUT ST (SAFETY) ×2 IMPLANT
EYE SHIELD UNIVERSAL CLEAR (GAUZE/BANDAGES/DRESSINGS) ×3 IMPLANT
GLOVE BIOGEL PI IND STRL 6.5 (GLOVE) IMPLANT
GLOVE BIOGEL PI IND STRL 7.0 (GLOVE) IMPLANT
GLOVE BIOGEL PI IND STRL 7.5 (GLOVE) IMPLANT
GLOVE BIOGEL PI INDICATOR 6.5 (GLOVE)
GLOVE BIOGEL PI INDICATOR 7.0 (GLOVE) ×2
GLOVE BIOGEL PI INDICATOR 7.5 (GLOVE)
GLOVE EXAM NITRILE LRG STRL (GLOVE) IMPLANT
GLOVE EXAM NITRILE MD LF STRL (GLOVE) ×2 IMPLANT
PAD ARMBOARD 7.5X6 YLW CONV (MISCELLANEOUS) ×2 IMPLANT
RING MALYGIN (MISCELLANEOUS) ×2 IMPLANT
SIGHTPATH CAT PROC W REG LENS (Ophthalmic Related) ×2 IMPLANT
SYRINGE 1CC 25X5/8 TB ECLIPSE (MISCELLANEOUS) ×2 IMPLANT
TAPE SURG TRANSPORE 1 IN (GAUZE/BANDAGES/DRESSINGS) IMPLANT
TAPE SURGICAL TRANSPORE 1 IN (GAUZE/BANDAGES/DRESSINGS) ×2
VISCOELASTIC ADDITIONAL (OPHTHALMIC RELATED) ×3 IMPLANT
WATER STERILE IRR 250ML POUR (IV SOLUTION) ×3 IMPLANT

## 2014-12-27 NOTE — Anesthesia Preprocedure Evaluation (Signed)
Anesthesia Evaluation  Patient identified by MRN, date of birth, ID band Patient awake    Reviewed: Allergy & Precautions, NPO status , Patient's Chart, lab work & pertinent test results  Airway Mallampati: I  TM Distance: >3 FB     Dental  (+) Poor Dentition, Missing   Pulmonary Current Smoker,  breath sounds clear to auscultation        Cardiovascular hypertension, Pt. on medications Rhythm:Regular Rate:Normal     Neuro/Psych Seizures -,  PSYCHIATRIC DISORDERS Depression Multiple sclerosis CVA    GI/Hepatic negative GI ROS,   Endo/Other    Renal/GU      Musculoskeletal   Abdominal   Peds  Hematology   Anesthesia Other Findings   Reproductive/Obstetrics                             Anesthesia Physical Anesthesia Plan  ASA: III  Anesthesia Plan: MAC   Post-op Pain Management:    Induction:   Airway Management Planned:   Additional Equipment:   Intra-op Plan:   Post-operative Plan:   Informed Consent: I have reviewed the patients History and Physical, chart, labs and discussed the procedure including the risks, benefits and alternatives for the proposed anesthesia with the patient or authorized representative who has indicated his/her understanding and acceptance.     Plan Discussed with:   Anesthesia Plan Comments:         Anesthesia Quick Evaluation

## 2014-12-27 NOTE — Transfer of Care (Signed)
Immediate Anesthesia Transfer of Care Note  Patient: Christy Joseph  Procedure(s) Performed: Procedure(s) (LRB): CATARACT EXTRACTION PHACO AND INTRAOCULAR LENS PLACEMENT (IOC) (Right)  Patient Location: Shortstay  Anesthesia Type: MAC  Level of Consciousness: awake  Airway & Oxygen Therapy: Patient Spontanous Breathing   Post-op Assessment: Report given to PACU RN, Post -op Vital signs reviewed and stable and Patient moving all extremities  Post vital signs: Reviewed and stable  Complications: No apparent anesthesia complications

## 2014-12-27 NOTE — Discharge Instructions (Addendum)
Cataract Surgery °Care After °Refer to this sheet in the next few weeks. These instructions provide you with information on caring for yourself after your procedure. Your caregiver may also give you more specific instructions. Your treatment has been planned according to current medical practices, but problems sometimes occur. Call your caregiver if you have any problems or questions after your procedure.  °HOME CARE INSTRUCTIONS  °· Avoid strenuous activities as directed by your caregiver. °· Ask your caregiver when you can resume driving. °· Use eyedrops or other medicines to help healing and control pressure inside your eye as directed by your caregiver. °· Only take over-the-counter or prescription medicines for pain, discomfort, or fever as directed by your caregiver. °· Do not to touch or rub your eyes. °· You may be instructed to use a protective shield during the first few days and nights after surgery. If not, wear sunglasses to protect your eyes. This is to protect the eye from pressure or from being accidentally bumped. °· Keep the area around your eye clean and dry. Avoid swimming or allowing water to hit you directly in the face while showering. Keep soap and shampoo out of your eyes. °· Do not bend or lift heavy objects. Bending increases pressure in the eye. You can walk, climb stairs, and do light household chores. °· Do not put a contact lens into the eye that had surgery until your caregiver says it is okay to do so. °· Ask your doctor when you can return to work. This will depend on the kind of work that you do. If you work in a dusty environment, you may be advised to wear protective eyewear for a period of time. °· Ask your caregiver when it will be safe to engage in sexual activity. °· Continue with your regular eye exams as directed by your caregiver. °What to expect: °· It is normal to feel itching and mild discomfort for a few days after cataract surgery. Some fluid discharge is also common,  and your eye may be sensitive to light and touch. °· After 1 to 2 days, even moderate discomfort should disappear. In most cases, healing will take about 6 weeks. °· If you received an intraocular lens (IOL), you may notice that colors are very bright or have a blue tinge. Also, if you have been in bright sunlight, everything may appear reddish for a few hours. If you see these color tinges, it is because your lens is clear and no longer cloudy. Within a few months after receiving an IOL, these extra colors should go away. When you have healed, you will probably need new glasses. °SEEK MEDICAL CARE IF:  °· You have increased bruising around your eye. °· You have discomfort not helped by medicine. °SEEK IMMEDIATE MEDICAL CARE IF:  °· You have a  fever. °· You have a worsening or sudden vision loss. °· You have redness, swelling, or increasing pain in the eye. °· You have a thick discharge from the eye that had surgery. °MAKE SURE YOU: °· Understand these instructions. °· Will watch your condition. °· Will get help right away if you are not doing well or get worse. °Document Released: 12/05/2004 Document Revised: 08/10/2011 Document Reviewed: 01/09/2011 °[employer]® Patient Information ©2015 [employer]. This information is not intended to replace advice given to you by your health care provider. Make sure you discuss any questions you have with your health care provider. ° ° ° °PATIENT INSTRUCTIONS °POST-ANESTHESIA ° °IMMEDIATELY FOLLOWING SURGERY:  Do not   drive or operate machinery for the first twenty four hours after surgery.  Do not make any important decisions for twenty four hours after surgery or while taking narcotic pain medications or sedatives.  If you develop intractable nausea and vomiting or a severe headache please notify your doctor immediately. ° °FOLLOW-UP:  Please make an appointment with your surgeon as instructed. You do not need to follow up with anesthesia unless specifically instructed to do  so. ° °WOUND CARE INSTRUCTIONS (if applicable):  Keep a dry clean dressing on the anesthesia/puncture wound site if there is drainage.  Once the wound has quit draining you may leave it open to air.  Generally you should leave the bandage intact for twenty four hours unless there is drainage.  If the epidural site drains for more than 36-48 hours please call the anesthesia department. ° °QUESTIONS?:  Please feel free to call your physician or the hospital operator if you have any questions, and they will be happy to assist you.    ° ° ° °

## 2014-12-27 NOTE — Op Note (Signed)
Date of procedure: December 27, 2014  Pre-operative diagnosis:   1. Visually significant cataract, Right Eye  2. Posterior synechiae, Right Eye  Post-operative diagnosis:   1. Visually significant cataract, Right Eye  2. Posterior synechiae, Right Eye  Procedure:   1. Lysis of posterior synechiae, Right Eye  2. Complex Removal of cataract via phacoemulsification and insertion of intra-ocular lens Hoya 250 +D into the capsular bag of the Right Eye  Attending surgeon: Rudy Jew. Makell Cyr, MD, MA  Anesthesia: MAC, Topical Lidocaine  Complications: None  Estimated Blood Loss: <89mL (minimal)  Specimens: None  Implants: As above  Indications:  Visually significant cataract, Right Eye; posterior synechiae, right eye  Procedure:  The patient was seen and identified in the pre-operative area. The operative eye was identified and dilated.  The operative eye was marked.  Topical Lidocaine was administered to the operative eye.     The patient was then to the operative suite and placed in the supine position.  A timeout was performed confirming the patient, procedure to be performed, and all other relevant information.   The patient's face was prepped and draped in the usual fashion for intra-ocular surgery.  A lid speculum was placed into the operative eye and the surgical microscope moved into place and focused.  A superotemporal paracentesis was created using a 20-gauge MVR blade.  Shugarcaine was injected into the anterior chamber.  Viscoelastic was injected into the anterior chamber.    An inferior paracentesis was created 300 degrees of posterior synechiae were lysed using viscodissection.  A temporal clear-corneal main wound incision was created using a 2.69mm microkeratome.  A Malugyan ring was placed  A continuous curvilinear capsulorrhexis was initiated using an irrigating cystitome and completed using capsulorrhexis forceps, with aid of Trypan Blue.  Hydrodissection and hydrodeliniation  were performed.  Viscoelastic was injected into the anterior chamber.  A phacoemulsification handpiece and a chopper as a second instrument were used to remove the nucleus and epinucleus. The irrigation/aspiration handpiece was used to remove any remaining cortical material.   The capsular bag was reinflated with viscoelastic, checked, and found to be intact. A Hoya i250 lens with 20.5D power was inserted into the capsular bag and dialed into place using a Sinskey hook.  The Malugyan ring was removed.  The irrigation/aspiration handpiece was used to remove any remaining viscoelastic.  A 10-0 Nylon suture was placed in the main wound and buried.  The clear corneal wound and paracentesis wounds were then hydrated and checked with Weck-Cels to be watertight.  The lid-speculum and drape was removed, and the patient's face was cleaned with a wet and dry 4x4.  Maxitrol was instilled in the eye before a clear shield was taped over the eye. The patient was taken to the post-operative care unit in good condition, having tolerated the procedure well.  Post-Op Instructions: The patient will follow up at [facility] for a same day post-operative evaluation and will receive all other orders and instructions.

## 2014-12-27 NOTE — Anesthesia Procedure Notes (Signed)
Procedure Name: MAC Date/Time: 12/27/2014 7:37 AM Performed by: Franco Nones Pre-anesthesia Checklist: Patient identified, Emergency Drugs available, Suction available, Timeout performed and Patient being monitored Patient Re-evaluated:Patient Re-evaluated prior to inductionOxygen Delivery Method: Nasal Cannula

## 2014-12-27 NOTE — Anesthesia Postprocedure Evaluation (Signed)
  Anesthesia Post-op Note  Patient: Christy Joseph  Procedure(s) Performed: Procedure(s) (LRB): CATARACT EXTRACTION PHACO AND INTRAOCULAR LENS PLACEMENT (IOC) (Right)  Patient Location:  Short Stay  Anesthesia Type: MAC  Level of Consciousness: awake  Airway and Oxygen Therapy: Patient Spontanous Breathing  Post-op Pain: none  Post-op Assessment: Post-op Vital signs reviewed, Patient's Cardiovascular Status Stable, Respiratory Function Stable, Patent Airway, No signs of Nausea or vomiting and Pain level controlled  Post-op Vital Signs: Reviewed and stable  Complications: No apparent anesthesia complications

## 2014-12-27 NOTE — H&P (Signed)
The patient was examined and no changes to her health were found.  The right eye was marked.  All questions answered.

## 2014-12-28 ENCOUNTER — Encounter (HOSPITAL_COMMUNITY): Payer: Self-pay | Admitting: Ophthalmology

## 2014-12-31 ENCOUNTER — Encounter (HOSPITAL_COMMUNITY): Payer: Self-pay | Admitting: Ophthalmology

## 2014-12-31 MED ORDER — NEOMYCIN-POLYMYXIN-DEXAMETH 3.5-10000-0.1 OP SUSP
OPHTHALMIC | Status: DC | PRN
  Administered 2014-12-27: 2 [drp] via OPHTHALMIC

## 2015-01-21 ENCOUNTER — Encounter (HOSPITAL_COMMUNITY): Payer: Self-pay

## 2015-01-21 ENCOUNTER — Encounter (HOSPITAL_COMMUNITY)
Admission: RE | Admit: 2015-01-21 | Discharge: 2015-01-21 | Disposition: A | Discharge status: Home / Self Care | Payer: Medicaid Other | Source: Ambulatory Visit | Attending: Ophthalmology | Admitting: Ophthalmology

## 2015-01-21 NOTE — Pre-Procedure Instructions (Signed)
Pre op assessment complete.  Patient verbalized understanding and was given call back number if any further questions arise.

## 2015-01-23 MED ORDER — LIDOCAINE HCL 3.5 % OP GEL
OPHTHALMIC | Status: AC
  Filled 2015-01-23: qty 1

## 2015-01-23 MED ORDER — TETRACAINE HCL 0.5 % OP SOLN
OPHTHALMIC | Status: AC
  Filled 2015-01-23: qty 2

## 2015-01-23 MED ORDER — PHENYLEPHRINE HCL 2.5 % OP SOLN
OPHTHALMIC | Status: AC
  Filled 2015-01-23: qty 15

## 2015-01-23 MED ORDER — LIDOCAINE HCL (PF) 1 % IJ SOLN
INTRAMUSCULAR | Status: AC
  Filled 2015-01-23: qty 2

## 2015-01-23 MED ORDER — NEOMYCIN-POLYMYXIN-DEXAMETH 3.5-10000-0.1 OP SUSP
OPHTHALMIC | Status: AC
  Filled 2015-01-23: qty 5

## 2015-01-23 MED ORDER — CYCLOPENTOLATE-PHENYLEPHRINE OP SOLN OPTIME - NO CHARGE
OPHTHALMIC | Status: AC
  Filled 2015-01-23: qty 2

## 2015-01-24 ENCOUNTER — Ambulatory Visit (HOSPITAL_COMMUNITY): Payer: Medicaid Other | Admitting: Anesthesiology

## 2015-01-24 ENCOUNTER — Ambulatory Visit (HOSPITAL_COMMUNITY)
Admission: RE | Admit: 2015-01-24 | Discharge: 2015-01-24 | Disposition: A | Discharge status: Home / Self Care | Payer: Medicaid Other | Source: Ambulatory Visit | Attending: Ophthalmology | Admitting: Ophthalmology

## 2015-01-24 ENCOUNTER — Encounter (HOSPITAL_COMMUNITY)
Admission: RE | Disposition: A | Discharge status: Home / Self Care | Payer: Self-pay | Source: Ambulatory Visit | Attending: Ophthalmology

## 2015-01-24 ENCOUNTER — Encounter (HOSPITAL_COMMUNITY): Payer: Self-pay | Admitting: *Deleted

## 2015-01-24 DIAGNOSIS — F17200 Nicotine dependence, unspecified, uncomplicated: Secondary | ICD-10-CM | POA: Diagnosis not present

## 2015-01-24 DIAGNOSIS — E780 Pure hypercholesterolemia: Secondary | ICD-10-CM | POA: Diagnosis not present

## 2015-01-24 DIAGNOSIS — Z79899 Other long term (current) drug therapy: Secondary | ICD-10-CM | POA: Diagnosis not present

## 2015-01-24 DIAGNOSIS — F329 Major depressive disorder, single episode, unspecified: Secondary | ICD-10-CM | POA: Diagnosis not present

## 2015-01-24 DIAGNOSIS — Z7982 Long term (current) use of aspirin: Secondary | ICD-10-CM | POA: Diagnosis not present

## 2015-01-24 DIAGNOSIS — H21542 Posterior synechiae (iris), left eye: Secondary | ICD-10-CM | POA: Diagnosis not present

## 2015-01-24 DIAGNOSIS — I10 Essential (primary) hypertension: Secondary | ICD-10-CM | POA: Diagnosis not present

## 2015-01-24 DIAGNOSIS — R569 Unspecified convulsions: Secondary | ICD-10-CM | POA: Diagnosis not present

## 2015-01-24 DIAGNOSIS — G35 Multiple sclerosis: Secondary | ICD-10-CM | POA: Diagnosis not present

## 2015-01-24 DIAGNOSIS — H269 Unspecified cataract: Secondary | ICD-10-CM | POA: Insufficient documentation

## 2015-01-24 HISTORY — PX: CATARACT EXTRACTION W/PHACO: SHX586

## 2015-01-24 SURGERY — PHACOEMULSIFICATION, CATARACT, WITH IOL INSERTION
Anesthesia: Monitor Anesthesia Care | Site: Eye | Laterality: Left

## 2015-01-24 MED ORDER — EPINEPHRINE HCL 1 MG/ML IJ SOLN
INTRAMUSCULAR | Status: AC
  Filled 2015-01-24: qty 1

## 2015-01-24 MED ORDER — FENTANYL CITRATE (PF) 100 MCG/2ML IJ SOLN
25.0000 ug | INTRAMUSCULAR | Status: AC
  Administered 2015-01-24 (×2): 25 ug via INTRAVENOUS

## 2015-01-24 MED ORDER — SODIUM HYALURONATE 23 MG/ML IO SOLN
INTRAOCULAR | Status: DC | PRN
  Administered 2015-01-24: 0.6 mL via INTRAOCULAR

## 2015-01-24 MED ORDER — LACTATED RINGERS IV SOLN
INTRAVENOUS | Status: DC
  Administered 2015-01-24: 1000 mL via INTRAVENOUS

## 2015-01-24 MED ORDER — EPINEPHRINE HCL 1 MG/ML IJ SOLN
INTRAOCULAR | Status: DC | PRN
  Administered 2015-01-24: 500 mL

## 2015-01-24 MED ORDER — SODIUM HYALURONATE 10 MG/ML IO SOLN
INTRAOCULAR | Status: DC | PRN
Start: 2015-01-24 — End: 2015-01-24
  Administered 2015-01-24 (×2): 0.85 mL via INTRAOCULAR

## 2015-01-24 MED ORDER — TRYPAN BLUE 0.06 % OP SOLN
OPHTHALMIC | Status: AC
  Filled 2015-01-24: qty 0.5

## 2015-01-24 MED ORDER — CYCLOPENTOLATE-PHENYLEPHRINE 0.2-1 % OP SOLN
1.0000 [drp] | OPHTHALMIC | Status: AC
  Administered 2015-01-24 (×3): 1 [drp] via OPHTHALMIC

## 2015-01-24 MED ORDER — PHENYLEPHRINE HCL 2.5 % OP SOLN
1.0000 [drp] | OPHTHALMIC | Status: AC
  Administered 2015-01-24 (×3): 1 [drp] via OPHTHALMIC

## 2015-01-24 MED ORDER — POVIDONE-IODINE 5 % OP SOLN
OPHTHALMIC | Status: DC | PRN
  Administered 2015-01-24: 1 via OPHTHALMIC

## 2015-01-24 MED ORDER — BSS IO SOLN
INTRAOCULAR | Status: DC | PRN
  Administered 2015-01-24: 15 mL

## 2015-01-24 MED ORDER — MIDAZOLAM HCL 2 MG/2ML IJ SOLN
INTRAMUSCULAR | Status: AC
  Filled 2015-01-24: qty 2

## 2015-01-24 MED ORDER — NEOMYCIN-POLYMYXIN-DEXAMETH 3.5-10000-0.1 OP SUSP
OPHTHALMIC | Status: DC | PRN
  Administered 2015-01-24: 2 [drp] via OPHTHALMIC

## 2015-01-24 MED ORDER — LIDOCAINE HCL 3.5 % OP GEL: 1.0000 "application " | Freq: Once | OPHTHALMIC | Status: DC

## 2015-01-24 MED ORDER — FENTANYL CITRATE (PF) 100 MCG/2ML IJ SOLN
INTRAMUSCULAR | Status: AC
  Filled 2015-01-24: qty 2

## 2015-01-24 MED ORDER — MIDAZOLAM HCL 2 MG/2ML IJ SOLN
1.0000 mg | INTRAMUSCULAR | Status: DC | PRN
Start: 2015-01-24 — End: 2015-01-24
  Administered 2015-01-24: 2 mg via INTRAVENOUS

## 2015-01-24 MED ORDER — LIDOCAINE HCL (PF) 1 % IJ SOLN
INTRAOCULAR | Status: DC | PRN
  Administered 2015-01-24: .8 mL via OPHTHALMIC

## 2015-01-24 MED ORDER — TETRACAINE HCL 0.5 % OP SOLN
1.0000 [drp] | OPHTHALMIC | Status: AC
  Administered 2015-01-24 (×3): 1 [drp] via OPHTHALMIC

## 2015-01-24 SURGICAL SUPPLY — 12 items
CLOTH BEACON ORANGE TIMEOUT ST (SAFETY) ×2 IMPLANT
ETHILON 10-0 SUTURE ×2 IMPLANT
EYE SHIELD UNIVERSAL CLEAR (GAUZE/BANDAGES/DRESSINGS) ×2 IMPLANT
GLOVE BIOGEL PI IND STRL 7.0 (GLOVE) IMPLANT
GLOVE BIOGEL PI INDICATOR 7.0 (GLOVE) ×2
PAD ARMBOARD 7.5X6 YLW CONV (MISCELLANEOUS) ×3 IMPLANT
RING MALYGIN (MISCELLANEOUS) ×3 IMPLANT
SIGHTPATH CAT PROC W REG LENS (Ophthalmic Related) ×2 IMPLANT
TAPE SURG TRANSPORE 1 IN (GAUZE/BANDAGES/DRESSINGS) IMPLANT
TAPE SURGICAL TRANSPORE 1 IN (GAUZE/BANDAGES/DRESSINGS) ×2
VISCOELASTIC ADDITIONAL (OPHTHALMIC RELATED) ×4 IMPLANT
WATER STERILE IRR 250ML POUR (IV SOLUTION) ×2 IMPLANT

## 2015-01-24 NOTE — H&P (Signed)
The H and P was reviewed and updated.  No changes were found after exam.  The surgical eye was marked.  

## 2015-01-24 NOTE — Anesthesia Postprocedure Evaluation (Signed)
  Anesthesia Post-op Note  Patient: Christy Joseph  Procedure(s) Performed: Procedure(s) with comments: CATARACT EXTRACTION PHACO AND INTRAOCULAR LENS PLACEMENT (IOC) (Left) - CDE 1.24  Patient Location: Short Stay  Anesthesia Type:MAC  Level of Consciousness: awake, alert  and oriented  Airway and Oxygen Therapy: Patient Spontanous Breathing  Post-op Pain: none  Post-op Assessment: Post-op Vital signs reviewed, Patient's Cardiovascular Status Stable, Respiratory Function Stable, Patent Airway and No signs of Nausea or vomiting              Post-op Vital Signs: Reviewed  Last Vitals:  Filed Vitals:   01/24/15 0735  BP: 99/67  Temp:   Resp: 11    Complications: No apparent anesthesia complications

## 2015-01-24 NOTE — Op Note (Signed)
Date of procedure: January 24, 2015  Pre-operative diagnosis:  1. Visually significant cataract, Left Eye 2. Posterior synechiae with poor dilation of pupil  Post-operative diagnosis: 1. Visually significant cataract, Left Eye 2. Posterior synechiae with poor dilation of pupil  Procedure:  1.Removal of cataract via phacoemulsification and insertion of intra-ocular lens Hoya 250 20.0+D into the capsular bag of the Left Eye 2. Lysis of synechiae, left eye  Attending surgeon: Rudy Jew. Anderson Middlebrooks, MD, MA  Anesthesia: MAC, Topical 0.75% Bupivicaine  Complications: None  Estimated Blood Loss: <42mL (minimal)  Specimens: None  Implants: As above  Indications:  Visually significant cataract, Left Eye; Posterior synechiae left eye  Procedure:  The patient was seen and identified in the pre-operative area. The operative eye was identified and dilated.  The operative eye was marked.  Topical Akten was administered to the operative eye.     The patient was then to the operative suite and placed in the supine position.  A timeout was performed confirming the patient, procedure to be performed, and all other relevant information.   The patient's face was prepped and draped in the usual fashion for intra-ocular surgery.  A lid speculum was placed into the operative eye and the surgical microscope moved into place and focused.  A superotemporal paracentesis was created using a 20-gauge MVR blade.  Shugarcaine was injected into the anterior chamber.  Viscoelastic was injected into the anterior chamber.  A viscolysis of posterior synechiae was performed.  A temporal clear-corneal main wound incision was created using a 2.40mm microkeratome.  A Malyugian ring was placed.  A continuous curvilinear capsulorrhexis was initiated using an irrigating cystitome and completed using capsulorrhexis forceps.  Hydrodissection and hydrodeliniation were performed.  Viscoelastic was injected into the anterior chamber.  A  phacoemulsification handpiece and a chopper as a second instrument were used to remove the nucleus and epinucleus. The irrigation/aspiration handpiece was used to remove any remaining cortical material.   The capsular bag was reinflated with viscoelastic, checked, and found to be intact. An Hoya 250   intraocular lens with a +20.0 D power was inserted into the capsular bag and dialed into place using a Sinskey hook.  The Malyugian ring was removed.  The irrigation/aspiration handpiece was used to remove any remaining viscoelastic.  The clear corneal wound and paracentesis wounds were then hydrated and checked with Weck-Cels to be watertight.  The lid-speculum and drape was removed, and the patient's face was cleaned with a wet and dry 4x4.  Maxitrol was instilled in the eye before a clear shield was taped over the eye. The patient was taken to the post-operative care unit in good condition, having tolerated the procedure well.  Post-Op Instructions: The patient will follow up at [facility] for a same day post-operative evaluation and will receive all other orders and instructions.

## 2015-01-24 NOTE — Discharge Instructions (Signed)

## 2015-01-24 NOTE — Anesthesia Preprocedure Evaluation (Signed)
Anesthesia Evaluation  Patient identified by MRN, date of birth, ID band Patient awake    Reviewed: Allergy & Precautions, NPO status , Patient's Chart, lab work & pertinent test results  Airway Mallampati: I  TM Distance: >3 FB     Dental  (+) Poor Dentition, Missing   Pulmonary Current Smoker,  breath sounds clear to auscultation        Cardiovascular hypertension, Pt. on medications Rhythm:Regular Rate:Normal     Neuro/Psych Seizures -,  PSYCHIATRIC DISORDERS Depression Multiple sclerosis CVA    GI/Hepatic negative GI ROS,   Endo/Other    Renal/GU      Musculoskeletal   Abdominal   Peds  Hematology   Anesthesia Other Findings   Reproductive/Obstetrics                             Anesthesia Physical Anesthesia Plan  ASA: III  Anesthesia Plan: MAC   Post-op Pain Management:    Induction:   Airway Management Planned:   Additional Equipment:   Intra-op Plan:   Post-operative Plan:   Informed Consent: I have reviewed the patients History and Physical, chart, labs and discussed the procedure including the risks, benefits and alternatives for the proposed anesthesia with the patient or authorized representative who has indicated his/her understanding and acceptance.     Plan Discussed with:   Anesthesia Plan Comments:         Anesthesia Quick Evaluation  

## 2015-01-24 NOTE — Transfer of Care (Signed)
Immediate Anesthesia Transfer of Care Note  Patient: Christy Joseph  Procedure(s) Performed: Procedure(s) with comments: CATARACT EXTRACTION PHACO AND INTRAOCULAR LENS PLACEMENT (IOC) (Left) - CDE 1.24  Patient Location: Short Stay  Anesthesia Type:MAC  Level of Consciousness: awake  Airway & Oxygen Therapy: Patient Spontanous Breathing  Post-op Assessment: Report given to RN  Post vital signs: Reviewed  Last Vitals:  Filed Vitals:   01/24/15 0735  BP: 99/67  Temp:   Resp: 11    Complications: No apparent anesthesia complications

## 2015-01-25 ENCOUNTER — Encounter (HOSPITAL_COMMUNITY): Payer: Self-pay | Admitting: Ophthalmology

## 2015-09-26 ENCOUNTER — Emergency Department (HOSPITAL_COMMUNITY): Payer: Medicare Other

## 2015-09-26 ENCOUNTER — Emergency Department (HOSPITAL_COMMUNITY)
Admission: EM | Admit: 2015-09-26 | Discharge: 2015-09-26 | Disposition: A | Discharge status: Home / Self Care | Payer: Medicare Other | Attending: Emergency Medicine | Admitting: Emergency Medicine

## 2015-09-26 ENCOUNTER — Encounter (HOSPITAL_COMMUNITY): Payer: Self-pay | Admitting: Emergency Medicine

## 2015-09-26 DIAGNOSIS — F17210 Nicotine dependence, cigarettes, uncomplicated: Secondary | ICD-10-CM | POA: Diagnosis not present

## 2015-09-26 DIAGNOSIS — Y999 Unspecified external cause status: Secondary | ICD-10-CM | POA: Insufficient documentation

## 2015-09-26 DIAGNOSIS — Y939 Activity, unspecified: Secondary | ICD-10-CM | POA: Insufficient documentation

## 2015-09-26 DIAGNOSIS — M545 Low back pain: Secondary | ICD-10-CM | POA: Diagnosis not present

## 2015-09-26 DIAGNOSIS — S3992XA Unspecified injury of lower back, initial encounter: Secondary | ICD-10-CM | POA: Diagnosis present

## 2015-09-26 DIAGNOSIS — Z7982 Long term (current) use of aspirin: Secondary | ICD-10-CM | POA: Diagnosis not present

## 2015-09-26 DIAGNOSIS — S32000A Wedge compression fracture of unspecified lumbar vertebra, initial encounter for closed fracture: Secondary | ICD-10-CM

## 2015-09-26 DIAGNOSIS — Z79899 Other long term (current) drug therapy: Secondary | ICD-10-CM | POA: Insufficient documentation

## 2015-09-26 DIAGNOSIS — F329 Major depressive disorder, single episode, unspecified: Secondary | ICD-10-CM | POA: Insufficient documentation

## 2015-09-26 DIAGNOSIS — Y929 Unspecified place or not applicable: Secondary | ICD-10-CM | POA: Insufficient documentation

## 2015-09-26 DIAGNOSIS — S32029A Unspecified fracture of second lumbar vertebra, initial encounter for closed fracture: Secondary | ICD-10-CM | POA: Diagnosis not present

## 2015-09-26 DIAGNOSIS — Z8673 Personal history of transient ischemic attack (TIA), and cerebral infarction without residual deficits: Secondary | ICD-10-CM | POA: Diagnosis not present

## 2015-09-26 DIAGNOSIS — I10 Essential (primary) hypertension: Secondary | ICD-10-CM | POA: Insufficient documentation

## 2015-09-26 MED ORDER — DOCUSATE SODIUM 100 MG PO CAPS: 100.0000 mg | ORAL_CAPSULE | Freq: Two times a day (BID) | ORAL | Status: DC

## 2015-09-26 MED ORDER — HYDROCODONE-ACETAMINOPHEN 5-325 MG PO TABS: ORAL_TABLET | ORAL | Status: DC

## 2015-09-26 NOTE — ED Notes (Addendum)
Patient states she has MS and fell today at 1200. Complaining of lower back pain since fall. States she is ambulatory since the fall.
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
## 2015-10-07 MED FILL — Hydrocodone-Acetaminophen Tab 5-325 MG: ORAL | Qty: 6 | Status: AC

## 2015-10-10 ENCOUNTER — Ambulatory Visit (INDEPENDENT_AMBULATORY_CARE_PROVIDER_SITE_OTHER): Payer: Medicare Other | Admitting: Family Medicine

## 2015-10-10 ENCOUNTER — Encounter: Payer: Self-pay | Admitting: Family Medicine

## 2015-10-10 VITALS — BP 109/80 | HR 49 | Temp 98.0°F | Ht 63.0 in | Wt 112.8 lb

## 2015-10-10 DIAGNOSIS — M8008XA Age-related osteoporosis with current pathological fracture, vertebra(e), initial encounter for fracture: Secondary | ICD-10-CM

## 2015-10-10 DIAGNOSIS — F329 Major depressive disorder, single episode, unspecified: Secondary | ICD-10-CM | POA: Insufficient documentation

## 2015-10-10 DIAGNOSIS — I10 Essential (primary) hypertension: Secondary | ICD-10-CM | POA: Insufficient documentation

## 2015-10-10 DIAGNOSIS — M8088XA Other osteoporosis with current pathological fracture, vertebra(e), initial encounter for fracture: Secondary | ICD-10-CM | POA: Diagnosis not present

## 2015-10-10 DIAGNOSIS — G35 Multiple sclerosis: Secondary | ICD-10-CM | POA: Insufficient documentation

## 2015-10-10 DIAGNOSIS — F32A Depression, unspecified: Secondary | ICD-10-CM | POA: Insufficient documentation

## 2015-10-10 DIAGNOSIS — M810 Age-related osteoporosis without current pathological fracture: Secondary | ICD-10-CM | POA: Diagnosis not present

## 2015-10-10 DIAGNOSIS — F418 Other specified anxiety disorders: Secondary | ICD-10-CM

## 2015-10-10 DIAGNOSIS — Z8673 Personal history of transient ischemic attack (TIA), and cerebral infarction without residual deficits: Secondary | ICD-10-CM | POA: Insufficient documentation

## 2015-10-10 DIAGNOSIS — R569 Unspecified convulsions: Secondary | ICD-10-CM | POA: Insufficient documentation

## 2015-10-10 DIAGNOSIS — E785 Hyperlipidemia, unspecified: Secondary | ICD-10-CM | POA: Insufficient documentation

## 2015-10-10 DIAGNOSIS — F419 Anxiety disorder, unspecified: Secondary | ICD-10-CM
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
## 2015-10-17 ENCOUNTER — Ambulatory Visit (INDEPENDENT_AMBULATORY_CARE_PROVIDER_SITE_OTHER): Payer: Medicare Other | Admitting: Pharmacist

## 2015-10-17 ENCOUNTER — Encounter: Payer: Self-pay | Admitting: Pharmacist

## 2015-10-17 ENCOUNTER — Encounter (INDEPENDENT_AMBULATORY_CARE_PROVIDER_SITE_OTHER): Payer: Self-pay

## 2015-10-17 VITALS — Ht 63.0 in | Wt 114.0 lb

## 2015-10-17 DIAGNOSIS — M810 Age-related osteoporosis without current pathological fracture: Secondary | ICD-10-CM

## 2015-10-17 MED ORDER — ALENDRONATE SODIUM 70 MG PO TABS: 70.0000 mg | ORAL_TABLET | ORAL | Status: DC

## 2015-10-17 MED ORDER — CALCIUM + D3 600-200 MG-UNIT PO TABS: 1.0000 | ORAL_TABLET | Freq: Every day | ORAL | Status: DC

## 2015-10-17 NOTE — Progress Notes (Signed)
Patient ID: Christy Joseph, female   DOB: Oct 26, 1960, 55 y.o.   MRN: [id number]  Osteoporosis Clinic Current Height: Height: 5\' 3"  (160 cm)       Current Weight: Weight: 114 lb (51.71 kg)       Ethnicity:African American    HPI: I have been asked to meet with Christy Joseph to discuss treatment for osteoporosis. She last has DEXA checked 06/2014.  She has never taken any medications for osteoporosis.  Diagnosed with vertebral fracture at ER 04/27/217.   Back Pain?  Yes       Kyphosis?  Yes Prior fracture?  No                                                             PMH: Hysterectomy?  No Oophorectomy?  No HRT? No Steroid Use?  No Thyroid med?  No History of cancer?  No History of digestive disorders (ie Crohn's)?  No Current or previous eating disorders?  No Last Vitamin D Result:  Not checked recently but she is taking vitamin D 5000IU daily as recommended for MS Last GFR Result:  Greater than 60 (12/19/2014)   FH/SH: Family history of osteoporosis?  Yes - mother Parent with history of hip fracture?  No  Exercise?  Yes but very limited / walking Smoking?  Yes   Alcohol?  No    Calcium Assessment Calcium Intake  # of servings/day  Calcium mg  Milk (8 oz) 0.5  x  300  = 150mg   Yogurt (4 oz) 0 x  200 = 0  Cheese (1 oz) 0.5 x  200 = 100mg   Other Calcium sources   250mg   Ca supplement 0 = 0   Estimated calcium intake per day 500mg     [facility]   Result Narrative  BONE DENSITOMETRY (DEXA), Jun 11, 2014 12:09:35 PM  INDICATION:  fx fall [id number] Multiple sclerosis (HCC)  TECHNICAL QUALITY: Good  CLINICAL HISTORY: Age: 68 years Race: Black  Gender: Female Menopausal Status: Postmenopausal Risk Factors: Smoke tobacco. Osteoporosis Therapy: None  TECHNIQUE: The study was performed using a GE Audiological scientist.  RESULTS: Lumbar Spine: BMD measured in L1-L4  region of interest is 0.750 g/cm2. T-score: - 3.6 Z-score: - 3.1   Comments: None.   FEMORAL NECK: BMD measured in left femoral neck region of interest is 0.675  g/cm2. T-score:  - 2.6 Z-score:  - 2.2 Comments: None  TOTAL HIP: BMD measured in left total hip region of interest is 0.593 g/cm2. T-score:  - 3.3 Z-score:  - 3.3 Comments: None    Assessment: Osteoporosis with vertebral fracture  Recommendations: 1.  Start  alendronate (FOSAMAX) 70mg  take 1 tablet weekly on empty stomach and with full glass of water.  Considering other options - Forteo, Reclast and Prolia however patient doesn't currently have any prescription drug coverage so cost of these is limited.  2.  recommend calcium 1200mg  daily through supplementation or diet.  3.  recommend weight bearing exercise as able -  30 minutes at least 4 days per week.   4.  Counseled and educated about fall risk and prevention. 5.  Patient unknowingly missed appt with Dr Ophelia Charter today - which was for consult regarding possible vertebralplasty.  Our referral dept is  trying to get rescheduled and will call patient's sister who takes her to all appts with time and date of appt.  Recheck DEXA:  1 year  Time spent counseling patient:  40 minutes

## 2015-10-17 NOTE — Patient Instructions (Signed)
Fall Prevention in the Home  Falls can cause injuries and can affect people from all age groups. There are many simple things that you can do to make your home safe and to help prevent falls. WHAT CAN I DO ON THE OUTSIDE OF MY HOME?  Regularly repair the edges of walkways and driveways and fix any cracks.  Remove high doorway thresholds.  Trim any shrubbery on the main path into your home.  Use bright outdoor lighting.  Clear walkways of debris and clutter, including tools and rocks.  Regularly check that handrails are securely fastened and in good repair. Both sides of any steps should have handrails.  Install guardrails along the edges of any raised decks or porches.  Have leaves, snow, and ice cleared regularly.  Use sand or salt on walkways during winter months.  In the garage, clean up any spills right away, including grease or oil spills. WHAT CAN I DO IN THE BATHROOM?  Use night lights.  Install grab bars by the toilet and in the tub and shower. Do not use towel bars as grab bars.  Use non-skid mats or decals on the floor of the tub or shower.  If you need to sit down while you are in the shower, use a plastic, non-slip stool..  Keep the floor dry. Immediately clean up any water that spills on the floor.  Remove soap buildup in the tub or shower on a regular basis.  Attach bath mats securely with double-sided non-slip rug tape.  Remove throw rugs and other tripping hazards from the floor. WHAT CAN I DO IN THE BEDROOM?  Use night lights.  Make sure that a bedside light is easy to reach.  Do not use oversized bedding that drapes onto the floor.  Have a firm chair that has side arms to use for getting dressed.  Remove throw rugs and other tripping hazards from the floor. WHAT CAN I DO IN THE KITCHEN?   Clean up any spills right away.  Avoid walking on wet floors.  Place frequently used items in easy-to-reach places.  If you need to reach for something  above you, use a sturdy step stool that has a grab bar.  Keep electrical cables out of the way.  Do not use floor polish or wax that makes floors slippery. If you have to use wax, make sure that it is non-skid floor wax.  Remove throw rugs and other tripping hazards from the floor. WHAT CAN I DO IN THE STAIRWAYS?  Do not leave any items on the stairs.  Make sure that there are handrails on both sides of the stairs. Fix handrails that are broken or loose. Make sure that handrails are as long as the stairways.  Check any carpeting to make sure that it is firmly attached to the stairs. Fix any carpet that is loose or worn.  Avoid having throw rugs at the top or bottom of stairways, or secure the rugs with carpet tape to prevent them from moving.  Make sure that you have a light switch at the top of the stairs and the bottom of the stairs. If you do not have them, have them installed. WHAT ARE SOME OTHER FALL PREVENTION TIPS?  Wear closed-toe shoes that fit well and support your feet. Wear shoes that have rubber soles or low heels.  When you use a stepladder, make sure that it is completely opened and that the sides are firmly locked. Have someone hold the ladder while you   are using it. Do not climb a closed stepladder.  Add color or contrast paint or tape to grab bars and handrails in your home. Place contrasting color strips on the first and last steps.  Use mobility aids as needed, such as canes, walkers, scooters, and crutches.  Turn on lights if it is dark. Replace any light bulbs that burn out.  Set up furniture so that there are clear paths. Keep the furniture in the same spot.  Fix any uneven floor surfaces.  Choose a carpet design that does not hide the edge of steps of a stairway.  Be aware of any and all pets.  Review your medicines with your healthcare provider. Some medicines can cause dizziness or changes in blood pressure, which increase your risk of falling. Talk  with your health care provider about other ways that you can decrease your risk of falls. This may include working with a physical therapist or trainer to improve your strength, balance, and endurance.   This information is not intended to replace advice given to you by your health care provider. Make sure you discuss any questions you have with your health care provider.   Document Released: 05/08/2002 Document Revised: 10/02/2014 Document Reviewed: 06/22/2014 Elsevier Interactive Patient Education 2016 [employer] Inc.  

## 2015-10-31 DIAGNOSIS — M545 Low back pain: Secondary | ICD-10-CM | POA: Diagnosis not present

## 2015-11-05 ENCOUNTER — Other Ambulatory Visit: Payer: Self-pay | Admitting: *Deleted

## 2015-11-05 DIAGNOSIS — S32020A Wedge compression fracture of second lumbar vertebra, initial encounter for closed fracture: Secondary | ICD-10-CM | POA: Diagnosis not present

## 2015-11-05 DIAGNOSIS — M4856XA Collapsed vertebra, not elsewhere classified, lumbar region, initial encounter for fracture: Secondary | ICD-10-CM | POA: Diagnosis not present

## 2015-11-05 MED ORDER — VITAMIN D3 125 MCG (5000 UT) PO TABS: 5000.0000 [IU] | ORAL_TABLET | Freq: Every day | ORAL | Status: DC

## 2015-11-05 MED ORDER — LISINOPRIL 20 MG PO TABS: 20.0000 mg | ORAL_TABLET | Freq: Every morning | ORAL | Status: DC

## 2015-11-05 MED ORDER — ALENDRONATE SODIUM 70 MG PO TABS: 70.0000 mg | ORAL_TABLET | ORAL | Status: DC

## 2015-11-05 MED ORDER — ASPIRIN 325 MG PO TABS: 325.0000 mg | ORAL_TABLET | Freq: Every day | ORAL | Status: DC

## 2015-11-05 MED ORDER — PRAVASTATIN SODIUM 10 MG PO TABS: 10.0000 mg | ORAL_TABLET | Freq: Every day | ORAL | Status: DC

## 2015-11-05 MED ORDER — CALCIUM + D3 600-200 MG-UNIT PO TABS: 1.0000 | ORAL_TABLET | Freq: Every day | ORAL | Status: AC

## 2015-11-05 MED ORDER — SERTRALINE HCL 50 MG PO TABS: 50.0000 mg | ORAL_TABLET | Freq: Every morning | ORAL | Status: DC

## 2015-11-05 MED ORDER — SERTRALINE HCL 100 MG PO TABS: 100.0000 mg | ORAL_TABLET | Freq: Every day | ORAL | Status: DC

## 2015-11-05 MED ORDER — DOCUSATE SODIUM 100 MG PO CAPS: 100.0000 mg | ORAL_CAPSULE | Freq: Two times a day (BID) | ORAL | Status: DC

## 2015-11-05 MED ORDER — LEVETIRACETAM 500 MG PO TABS: 500.0000 mg | ORAL_TABLET | Freq: Two times a day (BID) | ORAL | Status: DC

## 2015-11-07 ENCOUNTER — Encounter: Payer: Self-pay | Admitting: Family Medicine

## 2015-11-07 ENCOUNTER — Ambulatory Visit (INDEPENDENT_AMBULATORY_CARE_PROVIDER_SITE_OTHER): Payer: Medicare Other | Admitting: Family Medicine

## 2015-11-07 VITALS — BP 117/76 | HR 54 | Temp 98.4°F | Ht 63.0 in | Wt 116.0 lb

## 2015-11-07 DIAGNOSIS — F329 Major depressive disorder, single episode, unspecified: Secondary | ICD-10-CM

## 2015-11-07 DIAGNOSIS — F418 Other specified anxiety disorders: Secondary | ICD-10-CM

## 2015-11-07 DIAGNOSIS — I10 Essential (primary) hypertension: Secondary | ICD-10-CM

## 2015-11-07 DIAGNOSIS — F419 Anxiety disorder, unspecified: Principal | ICD-10-CM

## 2015-11-07 NOTE — Progress Notes (Signed)
BP 117/76 mmHg  Pulse 54  Temp(Src) 98.4 F (36.9 C) (Oral)  Ht  (1.6 m)  Wt 116 lb (52.617 kg)  BMI 20.55 kg/m2   Subjective:    Patient ID: Christy Joseph, female    DOB: [birth year], 55 y.o.   MRN: [id number]  HPI: Christy Joseph is a 55 y.o. female presenting on 11/07/2015 for Depression & anxiety   HPI Hypertension and depression and anxiety recheck. Patient is coming in for recheck on anxiety and depression and hypertension. She is currently on Zoloft for anxiety and depression and on lisinopril for hypertension. Her blood pressure today is 117/76. She says she is doing very well with her mood. The main thing she is concerned about is that she still has not been able to get her medication for her multiple sclerosis which we are trying to get for her. Patient denies headaches, blurred vision, chest pains, shortness of breath, or weakness. Denies any side effects from medication and is content with current medication.   Relevant past medical, surgical, family and social history reviewed and updated as indicated. Interim medical history since our last visit reviewed. Allergies and medications reviewed and updated.  Review of Systems  Constitutional: Negative for fever and chills.  HENT: Negative for congestion, ear discharge and ear pain.   Eyes: Negative for redness and visual disturbance.  Respiratory: Negative for chest tightness and shortness of breath.   Cardiovascular: Negative for chest pain and leg swelling.  Genitourinary: Negative for dysuria and difficulty urinating.  Musculoskeletal: Negative for back pain and gait problem.  Skin: Negative for rash.  Neurological: Negative for light-headedness and headaches.  Psychiatric/Behavioral: Negative for behavioral problems and agitation.  All other systems reviewed and are negative.   Per HPI unless specifically indicated above     Medication List       This list is accurate as of: 11/07/15  4:34 PM.  Always use  your most recent med list.               alendronate 70 MG tablet  Commonly known as:  FOSAMAX  Take 1 tablet (70 mg total) by mouth every 7 (seven) days. Take with a full glass of water on an empty stomach.     aspirin 325 MG tablet  Take 1 tablet (325 mg total) by mouth daily.     Calcium + D3 600-200 MG-UNIT Tabs  Take 1 tablet by mouth daily.     docusate sodium 100 MG capsule  Commonly known as:  COLACE  Take 1 capsule (100 mg total) by mouth every 12 (twelve) hours.     glatiramer 20 MG/ML injection  Commonly known as:  COPAXONE  Inject 20 mg into the skin daily.     HYDROcodone-acetaminophen 5-325 MG tablet  Commonly known as:  NORCO/VICODIN  Take one tab po q 4-6 hrs prn pain     levETIRAcetam 500 MG tablet  Commonly known as:  KEPPRA  Take 1 tablet (500 mg total) by mouth 2 (two) times daily.     lisinopril 20 MG tablet  Commonly known as:  PRINIVIL,ZESTRIL  Take 1 tablet (20 mg total) by mouth every morning.     pravastatin 10 MG tablet  Commonly known as:  PRAVACHOL  Take 1 tablet (10 mg total) by mouth daily.     sertraline 100 MG tablet  Commonly known as:  ZOLOFT  Take 1 tablet (100 mg total) by mouth daily.  sertraline 50 MG tablet  Commonly known as:  ZOLOFT  Take 1 tablet (50 mg total) by mouth every morning.     Vitamin D3 5000 units Tabs  Take 1 tablet (5,000 Units total) by mouth daily.           Objective:    BP 117/76 mmHg  Pulse 54  Temp(Src) 98.4 F (36.9 C) (Oral)  Ht 5\' 3"  (1.6 m)  Wt 116 lb (52.617 kg)  BMI 20.55 kg/m2  Wt Readings from Last 3 Encounters:  11/07/15 116 lb (52.617 kg)  10/17/15 114 lb (51.71 kg)  10/10/15 112 lb 12.8 oz (51.166 kg)    Physical Exam  Constitutional: She is oriented to person, place, and time. She appears well-developed and well-nourished. No distress.  Eyes: Conjunctivae and EOM are normal. Pupils are equal, round, and reactive to light.  Neck: Neck supple. No thyromegaly present.    Cardiovascular: Normal rate, regular rhythm, normal heart sounds and intact distal pulses.   No murmur heard. Pulmonary/Chest: Effort normal and breath sounds normal. No respiratory distress. She has no wheezes.  Musculoskeletal: Normal range of motion. She exhibits no edema or tenderness.  Lymphadenopathy:    She has no cervical adenopathy.  Neurological: She is alert and oriented to person, place, and time. Coordination normal.  Skin: Skin is warm and dry. No rash noted. She is not diaphoretic.  Psychiatric: She has a normal mood and affect. Her behavior is normal.  Nursing note and vitals reviewed.     Assessment & Plan:   Problem List Items Addressed This Visit      Cardiovascular and Mediastinum   Essential hypertension, benign     Other   Anxiety and depression - Primary       Follow up plan: Return in about 3 months (around 02/07/2016), or if symptoms worsen or fail to improve, for Recheck anxiety and depression and hypertension.  Counseling provided for all of the vaccine components No orders of the defined types were placed in this encounter.    Arville Care, MD [facility] 11/07/2015, 4:34 PM

## 2015-11-28 DIAGNOSIS — M545 Low back pain: Secondary | ICD-10-CM | POA: Diagnosis not present

## 2015-12-26 DIAGNOSIS — H20023 Recurrent acute iridocyclitis, bilateral: Secondary | ICD-10-CM | POA: Diagnosis not present

## 2015-12-26 DIAGNOSIS — Z961 Presence of intraocular lens: Secondary | ICD-10-CM | POA: Diagnosis not present

## 2016-01-13 ENCOUNTER — Telehealth: Payer: Self-pay | Admitting: Family Medicine

## 2016-01-14 ENCOUNTER — Other Ambulatory Visit: Payer: Self-pay

## 2016-01-14 DIAGNOSIS — Z8673 Personal history of transient ischemic attack (TIA), and cerebral infarction without residual deficits: Secondary | ICD-10-CM

## 2016-01-14 DIAGNOSIS — E785 Hyperlipidemia, unspecified: Secondary | ICD-10-CM

## 2016-01-14 DIAGNOSIS — I10 Essential (primary) hypertension: Secondary | ICD-10-CM

## 2016-01-14 NOTE — Telephone Encounter (Signed)
Labs put in for future by Mariam Dollarjan potter

## 2016-01-15 ENCOUNTER — Other Ambulatory Visit: Payer: Medicare Other

## 2016-01-15 DIAGNOSIS — I10 Essential (primary) hypertension: Secondary | ICD-10-CM | POA: Diagnosis not present

## 2016-01-15 DIAGNOSIS — G35 Multiple sclerosis: Secondary | ICD-10-CM | POA: Diagnosis not present

## 2016-01-15 DIAGNOSIS — Z8673 Personal history of transient ischemic attack (TIA), and cerebral infarction without residual deficits: Secondary | ICD-10-CM | POA: Diagnosis not present

## 2016-01-15 DIAGNOSIS — E785 Hyperlipidemia, unspecified: Secondary | ICD-10-CM

## 2016-01-16 LAB — CBC WITH DIFFERENTIAL/PLATELET
BASOS ABS: 0 10*3/uL (ref 0.0–0.2)
Basos: 0 %
EOS (ABSOLUTE): 0.1 10*3/uL (ref 0.0–0.4)
Eos: 2 %
Hematocrit: 39.5 % (ref 34.0–46.6)
Hemoglobin: 12.6 g/dL (ref 11.1–15.9)
Immature Grans (Abs): 0 10*3/uL (ref 0.0–0.1)
Immature Granulocytes: 0 %
LYMPHS ABS: 2.9 10*3/uL (ref 0.7–3.1)
Lymphs: 37 %
MCH: 31.8 pg (ref 26.6–33.0)
MCHC: 31.9 g/dL (ref 31.5–35.7)
MCV: 100 fL — ABNORMAL HIGH (ref 79–97)
MONOS ABS: 0.3 10*3/uL (ref 0.1–0.9)
Monocytes: 3 %
NEUTROS ABS: 4.7 10*3/uL (ref 1.4–7.0)
Neutrophils: 58 %
Platelets: 330 10*3/uL (ref 150–379)
RBC: 3.96 x10E6/uL (ref 3.77–5.28)
RDW: 16.2 % — AB (ref 12.3–15.4)
WBC: 8 10*3/uL (ref 3.4–10.8)

## 2016-01-16 LAB — LIPID PANEL
CHOL/HDL RATIO: 3 ratio (ref 0.0–4.4)
Cholesterol, Total: 154 mg/dL (ref 100–199)
HDL: 51 mg/dL (ref >39)
LDL Calculated: 85 mg/dL (ref 0–99)
TRIGLYCERIDES: 88 mg/dL (ref 0–149)
VLDL Cholesterol Cal: 18 mg/dL (ref 5–40)

## 2016-01-16 LAB — BMP8+EGFR
BUN/Creatinine Ratio: 12 (ref 9–23)
BUN: 9 mg/dL (ref 6–24)
CALCIUM: 10.3 mg/dL — AB (ref 8.7–10.2)
CO2: 24 mmol/L (ref 18–29)
CREATININE: 0.78 mg/dL (ref 0.57–1.00)
Chloride: 104 mmol/L (ref 96–106)
GFR calc Af Amer: 99 mL/min/{1.73_m2} (ref >59)
GFR, EST NON AFRICAN AMERICAN: 86 mL/min/{1.73_m2} (ref >59)
GLUCOSE: 93 mg/dL (ref 65–99)
POTASSIUM: 4.8 mmol/L (ref 3.5–5.2)
SODIUM: 145 mmol/L — AB (ref 134–144)

## 2016-01-23 ENCOUNTER — Other Ambulatory Visit: Payer: Self-pay | Admitting: Family Medicine

## 2016-01-23 MED ORDER — PRAVASTATIN SODIUM 10 MG PO TABS: 10.0000 mg | ORAL_TABLET | Freq: Every day | ORAL | 1 refills | Status: DC

## 2016-01-23 NOTE — Telephone Encounter (Signed)
done

## 2016-03-26 DIAGNOSIS — H20023 Recurrent acute iridocyclitis, bilateral: Secondary | ICD-10-CM | POA: Diagnosis not present

## 2016-03-26 DIAGNOSIS — Z961 Presence of intraocular lens: Secondary | ICD-10-CM | POA: Diagnosis not present

## 2016-05-04 ENCOUNTER — Telehealth: Payer: Self-pay | Admitting: Family Medicine

## 2016-05-05 NOTE — Telephone Encounter (Signed)
Okay go ahead and send refills

## 2016-05-06 MED ORDER — ALENDRONATE SODIUM 70 MG PO TABS: 70.0000 mg | ORAL_TABLET | ORAL | 2 refills | Status: DC

## 2016-05-06 MED ORDER — SERTRALINE HCL 50 MG PO TABS: 50.0000 mg | ORAL_TABLET | Freq: Every morning | ORAL | 0 refills | Status: DC

## 2016-05-06 MED ORDER — SERTRALINE HCL 100 MG PO TABS: 100.0000 mg | ORAL_TABLET | Freq: Every day | ORAL | 0 refills | Status: DC

## 2016-05-06 MED ORDER — LISINOPRIL 20 MG PO TABS: 20.0000 mg | ORAL_TABLET | Freq: Every morning | ORAL | 0 refills | Status: DC

## 2016-05-06 NOTE — Telephone Encounter (Signed)
Pt notified of refills Verbalizes understanding

## 2016-05-14 ENCOUNTER — Encounter: Payer: Self-pay | Admitting: Family Medicine

## 2016-05-14 ENCOUNTER — Ambulatory Visit (INDEPENDENT_AMBULATORY_CARE_PROVIDER_SITE_OTHER): Payer: Medicare Other | Admitting: Family Medicine

## 2016-05-14 VITALS — BP 95/65 | HR 55 | Temp 99.4°F | Ht 63.0 in | Wt 109.5 lb

## 2016-05-14 DIAGNOSIS — Z23 Encounter for immunization: Secondary | ICD-10-CM | POA: Diagnosis not present

## 2016-05-14 DIAGNOSIS — I10 Essential (primary) hypertension: Secondary | ICD-10-CM | POA: Diagnosis not present

## 2016-05-14 DIAGNOSIS — Z716 Tobacco abuse counseling: Secondary | ICD-10-CM

## 2016-05-14 DIAGNOSIS — E785 Hyperlipidemia, unspecified: Secondary | ICD-10-CM

## 2016-05-14 DIAGNOSIS — Z720 Tobacco use: Secondary | ICD-10-CM

## 2016-05-14 MED ORDER — LISINOPRIL 10 MG PO TABS: 10.0000 mg | ORAL_TABLET | Freq: Every morning | ORAL | 1 refills | Status: DC

## 2016-05-14 MED ORDER — BUPROPION HCL ER (SR) 150 MG PO TB12: 150.0000 mg | ORAL_TABLET | Freq: Two times a day (BID) | ORAL | 1 refills | Status: DC

## 2016-05-14 NOTE — Progress Notes (Signed)
BP 95/65   Pulse (!) 55   Temp 99.4 F (37.4 C) (Oral)   Ht 5' 3" (1.6 m)   Wt 109 lb 8 oz (49.7 kg)   BMI 19.40 kg/m    Subjective:    Patient ID: Christy Joseph, female    DOB: January 21, 1961, 55 y.o.   MRN: [id number]  HPI: Christy Joseph is a 55 y.o. female presenting on 05/14/2016 for Hyperlipidemia; Hypertension; and Nicotine Dependence (discuss quiting)   HPI Hypertension recheck Patient comes in today for a hypertension recheck. Her blood pressure today is 95/65. She is currently taking lisinopril 20 mg daily. She denies any lightheadedness or dizziness but there is concern without low blood pressure that she could be having that direction. She has lost a lot of weight over the past few years so that may be why her blood pressure has changed. Patient denies headaches, blurred vision, chest pains, shortness of breath, or weakness. Denies any side effects from medication and is content with current medication. Patient is a smoker and with her sister here motivated her she would like to try something to help her quit smoking. She has tried Chantix before without much success and many side effects. She would like to try something different if possible. She is also tried nicotine replacement before without much success.  Hyperlipidemia recheck Patient is coming in for a hyperlipidemia recheck of her medications. She is currently on pravastatin 10 mg. She denies any issues with myalgias or leg pains that she knows of. She denies any focal numbness or weakness.  Relevant past medical, surgical, family and social history reviewed and updated as indicated. Interim medical history since our last visit reviewed. Allergies and medications reviewed and updated.  Review of Systems  Constitutional: Negative for chills and fever.  Eyes: Negative for redness and visual disturbance.  Respiratory: Negative for chest tightness and shortness of breath.   Cardiovascular: Negative for chest pain and leg  swelling.  Genitourinary: Negative for difficulty urinating and dysuria.  Musculoskeletal: Negative for back pain and gait problem.  Skin: Negative for rash.  Neurological: Negative for dizziness, speech difficulty, weakness, light-headedness, numbness and headaches.  Psychiatric/Behavioral: Negative for agitation and behavioral problems.  All other systems reviewed and are negative.   Per HPI unless specifically indicated above   Allergies as of 05/14/2016   No Known Allergies     Medication List       Accurate as of 05/14/16  4:02 PM. Always use your most recent med list.          alendronate 70 MG tablet Commonly known as:  FOSAMAX Take 1 tablet (70 mg total) by mouth every 7 (seven) days. Take with a full glass of water on an empty stomach.   aspirin 325 MG tablet Take 1 tablet (325 mg total) by mouth daily.   buPROPion 150 MG 12 hr tablet Commonly known as:  WELLBUTRIN SR Take 1 tablet (150 mg total) by mouth 2 (two) times daily.   Calcium + D3 600-200 MG-UNIT Tabs Take 1 tablet by mouth daily.   docusate sodium 100 MG capsule Commonly known as:  COLACE Take 1 capsule (100 mg total) by mouth every 12 (twelve) hours.   glatiramer 20 MG/ML injection Commonly known as:  COPAXONE Inject 20 mg into the skin daily.   HYDROcodone-acetaminophen 5-325 MG tablet Commonly known as:  NORCO/VICODIN Take one tab po q 4-6 hrs prn pain   levETIRAcetam 500 MG tablet Commonly known as:  KEPPRA Take 1 tablet (500 mg total) by mouth 2 (two) times daily.   lisinopril 10 MG tablet Commonly known as:  PRINIVIL,ZESTRIL Take 1 tablet (10 mg total) by mouth every morning.   pravastatin 10 MG tablet Commonly known as:  PRAVACHOL Take 1 tablet (10 mg total) by mouth daily.   sertraline 100 MG tablet Commonly known as:  ZOLOFT Take 1 tablet (100 mg total) by mouth daily.   sertraline 50 MG tablet Commonly known as:  ZOLOFT Take 1 tablet (50 mg total) by mouth every  morning.   Vitamin D3 5000 units Tabs Take 1 tablet (5,000 Units total) by mouth daily.          Objective:    BP 95/65   Pulse (!) 55   Temp 99.4 F (37.4 C) (Oral)   Ht 5' 3" (1.6 m)   Wt 109 lb 8 oz (49.7 kg)   BMI 19.40 kg/m   Wt Readings from Last 3 Encounters:  05/14/16 109 lb 8 oz (49.7 kg)  11/07/15 116 lb (52.6 kg)  10/17/15 114 lb (51.7 kg)    Physical Exam  Constitutional: She is oriented to person, place, and time. She appears well-developed and well-nourished. No distress.  Eyes: Conjunctivae are normal.  Neck: Neck supple. No thyromegaly present.  Cardiovascular: Normal rate, regular rhythm, normal heart sounds and intact distal pulses.   No murmur heard. Pulmonary/Chest: Effort normal and breath sounds normal. No respiratory distress. She has no wheezes.  Musculoskeletal: Normal range of motion. She exhibits no edema or tenderness.  Lymphadenopathy:    She has no cervical adenopathy.  Neurological: She is alert and oriented to person, place, and time. Coordination normal.  Skin: Skin is warm and dry. No rash noted. She is not diaphoretic.  Psychiatric: She has a normal mood and affect. Her behavior is normal.  Nursing note and vitals reviewed.   Results for orders placed or performed in visit on 01/15/16  CBC with Differential/Platelet  Result Value Ref Range   WBC 8.0 3.4 - 10.8 x10E3/uL   RBC 3.96 3.77 - 5.28 x10E6/uL   Hemoglobin 12.6 11.1 - 15.9 g/dL   Hematocrit 39.5 34.0 - 46.6 %   MCV 100 (H) 79 - 97 fL   MCH 31.8 26.6 - 33.0 pg   MCHC 31.9 31.5 - 35.7 g/dL   RDW 16.2 (H) 12.3 - 15.4 %   Platelets 330 150 - 379 x10E3/uL   Neutrophils 58 %   Lymphs 37 %   Monocytes 3 %   Eos 2 %   Basos 0 %   Neutrophils Absolute 4.7 1.4 - 7.0 x10E3/uL   Lymphocytes Absolute 2.9 0.7 - 3.1 x10E3/uL   Monocytes Absolute 0.3 0.1 - 0.9 x10E3/uL   EOS (ABSOLUTE) 0.1 0.0 - 0.4 x10E3/uL   Basophils Absolute 0.0 0.0 - 0.2 x10E3/uL   Immature Granulocytes 0 %     Immature Grans (Abs) 0.0 0.0 - 0.1 x10E3/uL  Lipid panel  Result Value Ref Range   Cholesterol, Total 154 100 - 199 mg/dL   Triglycerides 88 0 - 149 mg/dL   HDL 51 >39 mg/dL   VLDL Cholesterol Cal 18 5 - 40 mg/dL   LDL Calculated 85 0 - 99 mg/dL   Chol/HDL Ratio 3.0 0.0 - 4.4 ratio units  BMP8+EGFR  Result Value Ref Range   Glucose 93 65 - 99 mg/dL   BUN 9 6 - 24 mg/dL   Creatinine, Ser 0.78 0.57 - 1.00 mg/dL     GFR calc non Af Amer 86 >59 mL/min/1.73   GFR calc Af Amer 99 >59 mL/min/1.73   BUN/Creatinine Ratio 12 9 - 23   Sodium 145 (H) 134 - 144 mmol/L   Potassium 4.8 3.5 - 5.2 mmol/L   Chloride 104 96 - 106 mmol/L   CO2 24 18 - 29 mmol/L   Calcium 10.3 (H) 8.7 - 10.2 mg/dL      Assessment & Plan:   Problem List Items Addressed This Visit      Cardiovascular and Mediastinum   Essential hypertension, benign   Relevant Medications   lisinopril (PRINIVIL,ZESTRIL) 10 MG tablet     Other   Hyperlipidemia LDL goal <130   Relevant Medications   lisinopril (PRINIVIL,ZESTRIL) 10 MG tablet    Other Visit Diagnoses    Encounter for smoking cessation counseling    -  Primary   Relevant Medications   buPROPion (WELLBUTRIN SR) 150 MG 12 hr tablet       Follow up plan: Return in about 6 months (around 11/12/2016), or if symptoms worsen or fail to improve, for Recheck hypertension and cholesterol.  Counseling provided for all of the vaccine components Orders Placed This Encounter  Procedures  . Flu Vaccine QUAD 36+ mos IM     , MD [facility] 05/14/2016, 4:02 PM     

## 2016-05-28 ENCOUNTER — Emergency Department (HOSPITAL_COMMUNITY): Payer: Medicare Other

## 2016-05-28 ENCOUNTER — Emergency Department (HOSPITAL_COMMUNITY)
Admission: EM | Admit: 2016-05-28 | Discharge: 2016-05-28 | Disposition: A | Discharge status: Home / Self Care | Payer: Medicare Other | Attending: Emergency Medicine | Admitting: Emergency Medicine

## 2016-05-28 ENCOUNTER — Encounter (HOSPITAL_COMMUNITY): Payer: Self-pay | Admitting: *Deleted

## 2016-05-28 DIAGNOSIS — S3992XA Unspecified injury of lower back, initial encounter: Secondary | ICD-10-CM | POA: Diagnosis present

## 2016-05-28 DIAGNOSIS — Z7982 Long term (current) use of aspirin: Secondary | ICD-10-CM | POA: Insufficient documentation

## 2016-05-28 DIAGNOSIS — S32009A Unspecified fracture of unspecified lumbar vertebra, initial encounter for closed fracture: Secondary | ICD-10-CM

## 2016-05-28 DIAGNOSIS — Y999 Unspecified external cause status: Secondary | ICD-10-CM | POA: Diagnosis not present

## 2016-05-28 DIAGNOSIS — I10 Essential (primary) hypertension: Secondary | ICD-10-CM | POA: Diagnosis not present

## 2016-05-28 DIAGNOSIS — F17210 Nicotine dependence, cigarettes, uncomplicated: Secondary | ICD-10-CM | POA: Diagnosis not present

## 2016-05-28 DIAGNOSIS — Z79899 Other long term (current) drug therapy: Secondary | ICD-10-CM | POA: Insufficient documentation

## 2016-05-28 DIAGNOSIS — M545 Low back pain: Secondary | ICD-10-CM | POA: Diagnosis not present

## 2016-05-28 DIAGNOSIS — Y939 Activity, unspecified: Secondary | ICD-10-CM | POA: Diagnosis not present

## 2016-05-28 DIAGNOSIS — S32020A Wedge compression fracture of second lumbar vertebra, initial encounter for closed fracture: Secondary | ICD-10-CM | POA: Insufficient documentation

## 2016-05-28 DIAGNOSIS — M25552 Pain in left hip: Secondary | ICD-10-CM | POA: Diagnosis not present

## 2016-05-28 DIAGNOSIS — Y92009 Unspecified place in unspecified non-institutional (private) residence as the place of occurrence of the external cause: Secondary | ICD-10-CM | POA: Diagnosis not present

## 2016-05-28 DIAGNOSIS — W19XXXA Unspecified fall, initial encounter: Secondary | ICD-10-CM

## 2016-05-28 DIAGNOSIS — W1839XA Other fall on same level, initial encounter: Secondary | ICD-10-CM | POA: Diagnosis not present

## 2016-05-28 DIAGNOSIS — S79912A Unspecified injury of left hip, initial encounter: Secondary | ICD-10-CM | POA: Diagnosis not present

## 2016-05-28 DIAGNOSIS — S32038A Other fracture of third lumbar vertebra, initial encounter for closed fracture: Secondary | ICD-10-CM | POA: Diagnosis not present

## 2016-05-28 MED ORDER — HYDROCODONE-ACETAMINOPHEN 5-325 MG PO TABS: ORAL_TABLET | ORAL | 0 refills | Status: DC

## 2016-05-28 MED ORDER — METHOCARBAMOL 500 MG PO TABS: 500.0000 mg | ORAL_TABLET | Freq: Three times a day (TID) | ORAL | 0 refills | Status: DC
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
## 2016-05-28 NOTE — ED Triage Notes (Signed)
Pt comes in for a fall occurring this morning. States she fell onto her bottom, denies hitting her head or any loss of consciousness. NAD noted. Pt has hx of MS and has an unsteady balance which made her fall. Denies any blood thinner use.

## 2016-05-28 NOTE — Discharge Instructions (Signed)
Get a lumbar brace from [facility] to wear when walking or standing.  Apply ice packs on/off.  Follow-up with your primary doctor or with your orthopedic doctor for recheck

## 2016-06-09 ENCOUNTER — Telehealth: Payer: Self-pay | Admitting: Family Medicine

## 2016-06-11 ENCOUNTER — Other Ambulatory Visit: Payer: Self-pay | Admitting: Family Medicine

## 2016-06-11 ENCOUNTER — Telehealth: Payer: Self-pay | Admitting: Family Medicine

## 2016-06-11 ENCOUNTER — Encounter: Payer: Self-pay | Admitting: Family Medicine

## 2016-06-11 ENCOUNTER — Ambulatory Visit (INDEPENDENT_AMBULATORY_CARE_PROVIDER_SITE_OTHER): Payer: Medicare Other | Admitting: Family Medicine

## 2016-06-11 VITALS — BP 121/79 | HR 56 | Temp 97.8°F | Ht 64.0 in | Wt 107.0 lb

## 2016-06-11 DIAGNOSIS — M4850XA Collapsed vertebra, not elsewhere classified, site unspecified, initial encounter for fracture: Secondary | ICD-10-CM

## 2016-06-11 DIAGNOSIS — IMO0001 Reserved for inherently not codable concepts without codable children: Secondary | ICD-10-CM

## 2016-06-11 DIAGNOSIS — M4850XS Collapsed vertebra, not elsewhere classified, site unspecified, sequela of fracture: Principal | ICD-10-CM

## 2016-06-11 NOTE — Progress Notes (Signed)
BP 121/79   Pulse (!) 56   Temp 97.8 F (36.6 C) (Oral)   Ht 5\' 4"  (1.626 m)   Wt 107 lb (48.5 kg)   BMI 18.37 kg/m    Subjective:    Patient ID: Christy Joseph, female    DOB: Jul 16, 1960, 56 y.o.   MRN: [id number]  HPI: Christy Joseph is a 56 y.o. female presenting on 06/11/2016 for Hospitalization Follow-up (fall, lumbar fractures)   HPI Compression fracture and fall/ER follow-up Patient had a fall and was seen in the emergency department for a vertebral compression fracture. She says that she was trying to move a television and twisted and fell back couple weeks ago and had some pain and then but that also more recently prior to the hospitalization she was trying to move something off of her bed and fell backwards onto her buttocks and had some pain then which took her into the emergency department. They did an x-ray which found a new compression fracture in L3. She has an orthopedic who she discussed options with previously about the L2 compression fracture that she had prior and he said that she was not a good candidate for repair but she will call again with this new one and see if she qualifies for that. She denies any pain in her back today and has not taken any pain medications besides the occasional Tylenol for it.  Relevant past medical, surgical, family and social history reviewed and updated as indicated. Interim medical history since our last visit reviewed. Allergies and medications reviewed and updated.  Review of Systems  Constitutional: Negative for chills and fever.  HENT: Negative for congestion, ear discharge and ear pain.   Eyes: Negative for redness and visual disturbance.  Respiratory: Negative for chest tightness and shortness of breath.   Cardiovascular: Negative for chest pain and leg swelling.  Genitourinary: Negative for difficulty urinating and dysuria.  Musculoskeletal: Negative for arthralgias, back pain and gait problem.  Skin: Negative for rash.    Neurological: Negative for light-headedness and headaches.  Psychiatric/Behavioral: Negative for agitation and behavioral problems.  All other systems reviewed and are negative.   Per HPI unless specifically indicated above     Objective:    BP 121/79   Pulse (!) 56   Temp 97.8 F (36.6 C) (Oral)   Ht 5\' 4"  (1.626 m)   Wt 107 lb (48.5 kg)   BMI 18.37 kg/m   Wt Readings from Last 3 Encounters:  06/11/16 107 lb (48.5 kg)  05/28/16 109 lb (49.4 kg)  05/14/16 109 lb 8 oz (49.7 kg)    Physical Exam  Constitutional: She is oriented to person, place, and time. She appears well-developed and well-nourished. No distress.  Eyes: Conjunctivae are normal.  Cardiovascular: Normal rate, regular rhythm, normal heart sounds and intact distal pulses.   No murmur heard. Pulmonary/Chest: Effort normal and breath sounds normal. No respiratory distress. She has no wheezes. She has no rales.  Musculoskeletal: Normal range of motion. She exhibits no edema or tenderness (No tenderness on exam).  Neurological: She is alert and oriented to person, place, and time. Coordination normal.  Skin: Skin is warm and dry. No rash noted. She is not diaphoretic.  Psychiatric: She has a normal mood and affect. Her behavior is normal.  Nursing note and vitals reviewed.     Assessment & Plan:   Problem List Items Addressed This Visit    None    Visit Diagnoses  Compression fracture of vertebra, sequela    -  Primary   New compression fracture on top of the old one, patient will call orthopedic and see if they need to do anything about it       Follow up plan: Return if symptoms worsen or fail to improve.  Counseling provided for all of the vaccine components No orders of the defined types were placed in this encounter.   Arville Care, MD [facility] 06/11/2016, 12:01 PM

## 2016-06-15 NOTE — Telephone Encounter (Signed)
Aware to stop aspirin 5 days prior to dental work. Note faxed to dentist.

## 2016-06-15 NOTE — Telephone Encounter (Signed)
Thanks Tammy for the help, please forward this information to the dentist office and inform the patient.

## 2016-06-15 NOTE — Telephone Encounter (Signed)
Tammy she is getting dental extractions, I was thinking until her to stop the aspirin 5 days before and restart it 5 days after but I didn't know on the exact timing for the Fosamax, what I your recommendations for this. Thank you Arville Care, MD [facility] 06/15/2016, 10:14 AM

## 2016-06-15 NOTE — Telephone Encounter (Signed)
Yes hold Aspirin for 5 days prior to extraction.  No need to hold fosamax unless she is having extensive dental surgery such as implants and we would usually hold fosamax 6 months prior.

## 2016-06-22 NOTE — Telephone Encounter (Signed)
Seen 06/11/2016

## 2016-06-25 ENCOUNTER — Encounter (INDEPENDENT_AMBULATORY_CARE_PROVIDER_SITE_OTHER): Payer: Self-pay | Admitting: Orthopaedic Surgery

## 2016-06-25 ENCOUNTER — Ambulatory Visit (INDEPENDENT_AMBULATORY_CARE_PROVIDER_SITE_OTHER): Payer: Medicare Other | Admitting: Orthopaedic Surgery

## 2016-06-25 VITALS — BP 137/82 | HR 55 | Ht 64.0 in | Wt 107.0 lb

## 2016-06-25 DIAGNOSIS — S32030A Wedge compression fracture of third lumbar vertebra, initial encounter for closed fracture: Secondary | ICD-10-CM | POA: Diagnosis not present
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
## 2016-07-17 DIAGNOSIS — Z7982 Long term (current) use of aspirin: Secondary | ICD-10-CM | POA: Diagnosis not present

## 2016-07-17 DIAGNOSIS — F17210 Nicotine dependence, cigarettes, uncomplicated: Secondary | ICD-10-CM | POA: Diagnosis not present

## 2016-07-17 DIAGNOSIS — Z79899 Other long term (current) drug therapy: Secondary | ICD-10-CM | POA: Diagnosis not present

## 2016-07-17 DIAGNOSIS — G35 Multiple sclerosis: Secondary | ICD-10-CM | POA: Diagnosis not present

## 2016-07-17 DIAGNOSIS — F329 Major depressive disorder, single episode, unspecified: Secondary | ICD-10-CM | POA: Diagnosis not present

## 2016-07-17 DIAGNOSIS — Z8673 Personal history of transient ischemic attack (TIA), and cerebral infarction without residual deficits: Secondary | ICD-10-CM | POA: Diagnosis not present

## 2016-07-17 DIAGNOSIS — R569 Unspecified convulsions: Secondary | ICD-10-CM | POA: Diagnosis not present

## 2016-07-17 DIAGNOSIS — R32 Unspecified urinary incontinence: Secondary | ICD-10-CM | POA: Diagnosis not present

## 2016-07-23 DIAGNOSIS — Z961 Presence of intraocular lens: Secondary | ICD-10-CM | POA: Diagnosis not present

## 2016-07-23 DIAGNOSIS — H20023 Recurrent acute iridocyclitis, bilateral: Secondary | ICD-10-CM | POA: Diagnosis not present

## 2016-07-23 DIAGNOSIS — H26492 Other secondary cataract, left eye: Secondary | ICD-10-CM | POA: Diagnosis not present

## 2016-07-31 ENCOUNTER — Other Ambulatory Visit: Payer: Self-pay

## 2016-07-31 MED ORDER — VARENICLINE TARTRATE 1 MG PO TABS: 1.0000 mg | ORAL_TABLET | Freq: Two times a day (BID) | ORAL | 1 refills | Status: DC

## 2016-08-03 ENCOUNTER — Other Ambulatory Visit: Payer: Self-pay | Admitting: *Deleted

## 2016-08-03 DIAGNOSIS — I10 Essential (primary) hypertension: Secondary | ICD-10-CM

## 2016-08-03 MED ORDER — ALENDRONATE SODIUM 70 MG PO TABS: 70.0000 mg | ORAL_TABLET | ORAL | 0 refills | Status: DC

## 2016-08-03 MED ORDER — LISINOPRIL 10 MG PO TABS: 10.0000 mg | ORAL_TABLET | Freq: Every morning | ORAL | 0 refills | Status: DC

## 2016-08-05 ENCOUNTER — Other Ambulatory Visit: Payer: Self-pay | Admitting: Family Medicine

## 2016-08-13 ENCOUNTER — Ambulatory Visit (INDEPENDENT_AMBULATORY_CARE_PROVIDER_SITE_OTHER): Payer: Medicare Other | Admitting: Orthopaedic Surgery

## 2016-08-13 ENCOUNTER — Encounter (INDEPENDENT_AMBULATORY_CARE_PROVIDER_SITE_OTHER): Payer: Self-pay | Admitting: Orthopaedic Surgery

## 2016-08-13 VITALS — BP 120/84 | HR 71 | Ht 64.0 in | Wt 115.0 lb

## 2016-08-13 DIAGNOSIS — S32030D Wedge compression fracture of third lumbar vertebra, subsequent encounter for fracture with routine healing: Secondary | ICD-10-CM
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
## 2016-09-01 ENCOUNTER — Other Ambulatory Visit: Payer: Self-pay | Admitting: Family Medicine

## 2016-09-08 ENCOUNTER — Other Ambulatory Visit: Payer: Self-pay | Admitting: Family Medicine

## 2016-09-17 ENCOUNTER — Ambulatory Visit (INDEPENDENT_AMBULATORY_CARE_PROVIDER_SITE_OTHER): Payer: Medicare Other | Admitting: *Deleted

## 2016-09-17 VITALS — BP 126/82 | HR 54 | Temp 97.4°F | Ht 64.0 in | Wt 107.0 lb

## 2016-09-17 DIAGNOSIS — Z23 Encounter for immunization: Secondary | ICD-10-CM

## 2016-09-17 DIAGNOSIS — Z0000 Encounter for general adult medical examination without abnormal findings: Secondary | ICD-10-CM
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
## 2016-09-17 NOTE — Patient Instructions (Addendum)
  Christy Joseph , Thank you for taking time to come for your Medicare Wellness Visit. I appreciate your ongoing commitment to your health goals. Please review the following plan we discussed and let me know if I can assist you in the future.   These are the goals we discussed: Goals    . Exercise 3x per week (30 min per time)    . Have 3 meals a day       This is a list of the screening recommended for you and due dates:  Health Maintenance  Topic Date Due  .  Hepatitis C: One time screening is recommended by Center for Disease Control  (CDC) for  adults born from [birth year] through 1965.   11/19/1960  . HIV Screening  06/10/1975  . Pap Smear  06/09/1981  . Mammogram  02/21/2014  . Flu Shot  12/30/2016  . Tetanus Vaccine  03/11/2022  . Colon Cancer Screening  02/21/2024  Prevnar 13 today, appt made for Oct 29, 2016 will do CXR, EKG, pap, hep c, and labs. We will hold off on DExa, ortho will be doing in a month or two.

## 2016-09-19 ENCOUNTER — Other Ambulatory Visit: Payer: Self-pay | Admitting: Family Medicine

## 2016-10-19 ENCOUNTER — Telehealth: Payer: Self-pay | Admitting: Family Medicine

## 2016-10-29 ENCOUNTER — Other Ambulatory Visit: Payer: Self-pay | Admitting: Family Medicine

## 2016-10-29 ENCOUNTER — Encounter: Payer: Self-pay | Admitting: Family Medicine

## 2016-10-29 ENCOUNTER — Ambulatory Visit (INDEPENDENT_AMBULATORY_CARE_PROVIDER_SITE_OTHER): Payer: Medicare Other | Admitting: Family Medicine

## 2016-10-29 VITALS — BP 119/78 | HR 60 | Temp 98.0°F | Ht 64.0 in | Wt 105.0 lb

## 2016-10-29 DIAGNOSIS — N810 Urethrocele: Secondary | ICD-10-CM

## 2016-10-29 DIAGNOSIS — Z131 Encounter for screening for diabetes mellitus: Secondary | ICD-10-CM | POA: Diagnosis not present

## 2016-10-29 DIAGNOSIS — Z13220 Encounter for screening for lipoid disorders: Secondary | ICD-10-CM

## 2016-10-29 DIAGNOSIS — E785 Hyperlipidemia, unspecified: Secondary | ICD-10-CM | POA: Diagnosis not present

## 2016-10-29 DIAGNOSIS — R636 Underweight: Secondary | ICD-10-CM | POA: Diagnosis not present

## 2016-10-29 DIAGNOSIS — Z01419 Encounter for gynecological examination (general) (routine) without abnormal findings: Secondary | ICD-10-CM

## 2016-10-29 DIAGNOSIS — N6320 Unspecified lump in the left breast, unspecified quadrant: Secondary | ICD-10-CM

## 2016-10-29 NOTE — Progress Notes (Signed)
BP 119/78   Pulse 60   Temp 98 F (36.7 C) (Oral)   Ht '5\' 4"'  (1.626 m)   Wt 105 lb (47.6 kg)   BMI 18.02 kg/m    Subjective:    Patient ID: Christy Joseph, female    DOB: 1961/01/10, 56 y.o.   MRN: [id number]  HPI: Christy Joseph is a 56 y.o. female presenting on 10/29/2016 for Gynecologic Exam   HPI Well woman and gynecological exam Patient is coming in today for well woman exam and gynecological exam and physical. She has not had one in the while. She has never had any abnormals in the past. She has had gonorrhea and chlamydia in the past but never got confirmation that was treated about 5 years ago and she would like to be tested for that as well today. Patient is underweight and has urinary incontinence and MS which causes a lot of weakness. Her urinary incontinence has been going on for some time and may be related to the MS but wanted to check in to see if there is anything else wrong in her pelvic area on exam today. She denies any fevers or chills or dysuria or hematuria. She does admit that he has poor appetite and does not eat as much as she should.  Relevant past medical, surgical, family and social history reviewed and updated as indicated. Interim medical history since our last visit reviewed. Allergies and medications reviewed and updated.  Review of Systems  Constitutional: Negative for chills and fever.  HENT: Negative for ear pain and tinnitus.   Eyes: Negative for pain.  Respiratory: Negative for cough, shortness of breath and wheezing.   Cardiovascular: Negative for chest pain, palpitations and leg swelling.  Gastrointestinal: Negative for abdominal pain, blood in stool, constipation and diarrhea.  Genitourinary: Positive for frequency and urgency. Negative for difficulty urinating, dysuria and hematuria.  Musculoskeletal: Negative for back pain and myalgias.  Skin: Negative for rash.  Neurological: Negative for dizziness, weakness and headaches.    Psychiatric/Behavioral: Negative for suicidal ideas.    Per HPI unless specifically indicated above        Objective:    BP 119/78   Pulse 60   Temp 98 F (36.7 C) (Oral)   Ht '5\' 4"'  (1.626 m)   Wt 105 lb (47.6 kg)   BMI 18.02 kg/m   Wt Readings from Last 3 Encounters:  10/29/16 105 lb (47.6 kg)  09/17/16 107 lb (48.5 kg)  08/13/16 115 lb (52.2 kg)    Physical Exam  Constitutional: She is oriented to person, place, and time. She appears well-developed and well-nourished. No distress.  Eyes: Conjunctivae are normal.  Neck: Neck supple. No thyromegaly present.  Cardiovascular: Normal rate, regular rhythm, normal heart sounds and intact distal pulses.   No murmur heard. Pulmonary/Chest: Effort normal and breath sounds normal. No respiratory distress. She has no wheezes. She has no rales. Right breast exhibits no inverted nipple, no mass, no nipple discharge, no skin change and no tenderness. Left breast exhibits mass. Left breast exhibits no inverted nipple, no nipple discharge, no skin change and no tenderness. Breasts are symmetrical.    Abdominal: Soft. Bowel sounds are normal. She exhibits no distension. There is no tenderness. There is no rebound and no guarding.  Genitourinary: Vagina normal and uterus normal. There is no rash, tenderness or lesion on the right labia. There is no rash, tenderness or lesion on the left labia. Uterus is not deviated, not enlarged,  not fixed and not tender. Cervix exhibits no motion tenderness (Nulliparous cervix), no discharge and no friability. Right adnexum displays no mass, no tenderness and no fullness. Left adnexum displays no mass and no tenderness.  Musculoskeletal: Normal range of motion.  Lymphadenopathy:    She has no cervical adenopathy.  Neurological: She is alert and oriented to person, place, and time. Coordination normal.  Skin: Skin is warm and dry. No rash noted. She is not diaphoretic.  Psychiatric: She has a normal mood  and affect. Her behavior is normal.  Nursing note and vitals reviewed.       Assessment & Plan:   Problem List Items Addressed This Visit    None    Visit Diagnoses    Well woman exam with routine gynecological exam    -  Primary   Relevant Orders   CBC with Differential/Platelet   CMP14+EGFR   Lipid panel   HIV antibody   GC/Chlamydia Probe Amp   Pap IG, rfx HPV all pth   MM Digital Screening Unilat R   Urethrocele, female       Will consider whether or not she wants to go see urology, does have incontinence, will let me know in the future   Lipid screening       Relevant Orders   Lipid panel   Diabetes mellitus screening       Relevant Orders   CMP14+EGFR   Underweight       Relevant Orders   CBC with Differential/Platelet   CMP14+EGFR   Lipid panel   Left breast mass       Relevant Orders   MM Digital Diagnostic Unilat L       Follow up plan: Return in about 1 year (around 10/29/2017), or if symptoms worsen or fail to improve.  Counseling provided for all of the vaccine components Orders Placed This Encounter  Procedures  . GC/Chlamydia Probe Amp  . CBC with Differential/Platelet  . CMP14+EGFR  . Lipid panel  . HIV antibody    Caryl Pina, MD [facility] 10/29/2016, 11:55 AM

## 2016-10-30 LAB — LIPID PANEL
CHOL/HDL RATIO: 2.9 ratio (ref 0.0–4.4)
Cholesterol, Total: 148 mg/dL (ref 100–199)
HDL: 51 mg/dL (ref >39)
LDL Calculated: 79 mg/dL (ref 0–99)
Triglycerides: 90 mg/dL (ref 0–149)
VLDL Cholesterol Cal: 18 mg/dL (ref 5–40)

## 2016-10-30 LAB — CBC WITH DIFFERENTIAL/PLATELET
BASOS: 0 %
Basophils Absolute: 0 10*3/uL (ref 0.0–0.2)
EOS (ABSOLUTE): 0.1 10*3/uL (ref 0.0–0.4)
EOS: 1 %
HEMATOCRIT: 41.6 % (ref 34.0–46.6)
HEMOGLOBIN: 13.4 g/dL (ref 11.1–15.9)
IMMATURE GRANS (ABS): 0 10*3/uL (ref 0.0–0.1)
IMMATURE GRANULOCYTES: 0 %
Lymphocytes Absolute: 3.3 10*3/uL — ABNORMAL HIGH (ref 0.7–3.1)
Lymphs: 41 %
MCH: 32.3 pg (ref 26.6–33.0)
MCHC: 32.2 g/dL (ref 31.5–35.7)
MCV: 100 fL — AB (ref 79–97)
Monocytes Absolute: 0.3 10*3/uL (ref 0.1–0.9)
Monocytes: 4 %
Neutrophils Absolute: 4.5 10*3/uL (ref 1.4–7.0)
Neutrophils: 54 %
Platelets: 339 10*3/uL (ref 150–379)
RBC: 4.15 x10E6/uL (ref 3.77–5.28)
RDW: 14.9 % (ref 12.3–15.4)
WBC: 8.2 10*3/uL (ref 3.4–10.8)

## 2016-10-30 LAB — CMP14+EGFR
A/G RATIO: 2 (ref 1.2–2.2)
ALBUMIN: 4.7 g/dL (ref 3.5–5.5)
ALT: 9 IU/L (ref 0–32)
AST: 12 IU/L (ref 0–40)
Alkaline Phosphatase: 89 IU/L (ref 39–117)
BUN/Creatinine Ratio: 15 (ref 9–23)
BUN: 13 mg/dL (ref 6–24)
Bilirubin Total: <0.2 mg/dL (ref 0.0–1.2)
CALCIUM: 10.5 mg/dL — AB (ref 8.7–10.2)
CO2: 26 mmol/L (ref 18–29)
Chloride: 103 mmol/L (ref 96–106)
Creatinine, Ser: 0.85 mg/dL (ref 0.57–1.00)
GFR, EST AFRICAN AMERICAN: 89 mL/min/{1.73_m2} (ref >59)
GFR, EST NON AFRICAN AMERICAN: 77 mL/min/{1.73_m2} (ref >59)
GLOBULIN, TOTAL: 2.4 g/dL (ref 1.5–4.5)
Glucose: 76 mg/dL (ref 65–99)
POTASSIUM: 4.3 mmol/L (ref 3.5–5.2)
Sodium: 142 mmol/L (ref 134–144)
TOTAL PROTEIN: 7.1 g/dL (ref 6.0–8.5)

## 2016-10-30 LAB — HIV ANTIBODY (ROUTINE TESTING W REFLEX): HIV Screen 4th Generation wRfx: NONREACTIVE

## 2016-10-31 LAB — GC/CHLAMYDIA PROBE AMP
CHLAMYDIA, DNA PROBE: NEGATIVE
Neisseria gonorrhoeae by PCR: NEGATIVE

## 2016-11-01 LAB — PAP IG, RFX HPV ALL PTH: PAP Smear Comment: 0

## 2016-11-12 ENCOUNTER — Ambulatory Visit: Payer: Medicare Other | Admitting: Family Medicine

## 2016-11-17 ENCOUNTER — Ambulatory Visit (HOSPITAL_COMMUNITY)
Admission: RE | Admit: 2016-11-17 | Discharge: 2016-11-17 | Disposition: A | Discharge status: Home / Self Care | Payer: Medicare Other | Source: Ambulatory Visit | Attending: Family Medicine | Admitting: Family Medicine

## 2016-11-17 DIAGNOSIS — N6320 Unspecified lump in the left breast, unspecified quadrant: Secondary | ICD-10-CM | POA: Insufficient documentation

## 2016-11-17 DIAGNOSIS — L723 Sebaceous cyst: Secondary | ICD-10-CM | POA: Diagnosis not present

## 2016-11-17 DIAGNOSIS — R928 Other abnormal and inconclusive findings on diagnostic imaging of breast: Secondary | ICD-10-CM | POA: Diagnosis not present

## 2016-11-30 ENCOUNTER — Other Ambulatory Visit: Payer: Self-pay | Admitting: Family Medicine

## 2016-12-02 ENCOUNTER — Other Ambulatory Visit: Payer: Self-pay | Admitting: Family Medicine

## 2016-12-02 DIAGNOSIS — I10 Essential (primary) hypertension: Secondary | ICD-10-CM

## 2016-12-13 ENCOUNTER — Other Ambulatory Visit: Payer: Self-pay | Admitting: Family

## 2016-12-14 DIAGNOSIS — G35 Multiple sclerosis: Secondary | ICD-10-CM | POA: Diagnosis not present

## 2016-12-14 DIAGNOSIS — F17210 Nicotine dependence, cigarettes, uncomplicated: Secondary | ICD-10-CM | POA: Diagnosis not present

## 2016-12-14 DIAGNOSIS — G40909 Epilepsy, unspecified, not intractable, without status epilepticus: Secondary | ICD-10-CM | POA: Diagnosis not present

## 2016-12-14 DIAGNOSIS — Z8673 Personal history of transient ischemic attack (TIA), and cerebral infarction without residual deficits: Secondary | ICD-10-CM | POA: Diagnosis not present

## 2016-12-14 DIAGNOSIS — F329 Major depressive disorder, single episode, unspecified: Secondary | ICD-10-CM | POA: Diagnosis not present

## 2016-12-14 DIAGNOSIS — Z79899 Other long term (current) drug therapy: Secondary | ICD-10-CM | POA: Diagnosis not present

## 2017-01-13 ENCOUNTER — Other Ambulatory Visit: Payer: Self-pay | Admitting: Family Medicine

## 2017-01-28 ENCOUNTER — Ambulatory Visit (INDEPENDENT_AMBULATORY_CARE_PROVIDER_SITE_OTHER): Payer: Medicare Other | Admitting: Orthopaedic Surgery

## 2017-02-02 ENCOUNTER — Other Ambulatory Visit: Payer: Self-pay | Admitting: Family

## 2017-02-04 ENCOUNTER — Ambulatory Visit (INDEPENDENT_AMBULATORY_CARE_PROVIDER_SITE_OTHER): Payer: Medicare Other | Admitting: Orthopaedic Surgery

## 2017-02-04 ENCOUNTER — Encounter (INDEPENDENT_AMBULATORY_CARE_PROVIDER_SITE_OTHER): Payer: Self-pay | Admitting: Orthopaedic Surgery

## 2017-02-04 ENCOUNTER — Ambulatory Visit (INDEPENDENT_AMBULATORY_CARE_PROVIDER_SITE_OTHER): Payer: Self-pay

## 2017-02-04 DIAGNOSIS — S32030D Wedge compression fracture of third lumbar vertebra, subsequent encounter for fracture with routine healing: Secondary | ICD-10-CM | POA: Diagnosis not present
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
## 2017-03-03 ENCOUNTER — Other Ambulatory Visit: Payer: Self-pay | Admitting: Family Medicine

## 2017-03-03 NOTE — Telephone Encounter (Signed)
Last seen 10/29/16  Dr Dettinger

## 2017-05-27 ENCOUNTER — Other Ambulatory Visit: Payer: Self-pay | Admitting: *Deleted

## 2017-05-27 MED ORDER — SERTRALINE HCL 100 MG PO TABS: 100.0000 mg | ORAL_TABLET | Freq: Every day | ORAL | 0 refills | Status: DC

## 2017-05-30 ENCOUNTER — Other Ambulatory Visit: Payer: Self-pay | Admitting: Family Medicine

## 2017-06-02 NOTE — Telephone Encounter (Signed)
Last seen 10/29/16  Dr Dettinger 

## 2017-06-19 ENCOUNTER — Emergency Department (HOSPITAL_COMMUNITY)
Admission: EM | Admit: 2017-06-19 | Discharge: 2017-06-19 | Disposition: A | Discharge status: Home / Self Care | Payer: Medicare Other | Attending: Emergency Medicine | Admitting: Emergency Medicine

## 2017-06-19 ENCOUNTER — Emergency Department (HOSPITAL_COMMUNITY): Payer: Medicare Other

## 2017-06-19 ENCOUNTER — Encounter (HOSPITAL_COMMUNITY): Payer: Self-pay | Admitting: *Deleted

## 2017-06-19 DIAGNOSIS — F17210 Nicotine dependence, cigarettes, uncomplicated: Secondary | ICD-10-CM | POA: Diagnosis not present

## 2017-06-19 DIAGNOSIS — Z7982 Long term (current) use of aspirin: Secondary | ICD-10-CM | POA: Diagnosis not present

## 2017-06-19 DIAGNOSIS — I10 Essential (primary) hypertension: Secondary | ICD-10-CM | POA: Diagnosis not present

## 2017-06-19 DIAGNOSIS — S79911A Unspecified injury of right hip, initial encounter: Secondary | ICD-10-CM | POA: Diagnosis not present

## 2017-06-19 DIAGNOSIS — Z79899 Other long term (current) drug therapy: Secondary | ICD-10-CM | POA: Diagnosis not present

## 2017-06-19 DIAGNOSIS — M79651 Pain in right thigh: Secondary | ICD-10-CM | POA: Insufficient documentation

## 2017-06-19 MED ORDER — IBUPROFEN 400 MG PO TABS: 400.0000 mg | ORAL_TABLET | Freq: Four times a day (QID) | ORAL | 0 refills | Status: DC | PRN

## 2017-06-19 MED ORDER — HYDROCODONE-ACETAMINOPHEN 5-325 MG PO TABS: 1.0000 | ORAL_TABLET | ORAL | 0 refills | Status: DC | PRN
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
## 2017-06-19 NOTE — Discharge Instructions (Signed)
You may take the medicines prescribed for pain and inflammation. You may take the hydrocodone prescribed for pain relief.  This will make you drowsy - do not drive within 4 hours of taking this medication.  Apply a heating pad to your thigh 20 minutes 3-4 times daily.  Get rechecked by your primary doctor if your symptoms persist.  Your x-rays are negative today.

## 2017-06-19 NOTE — ED Triage Notes (Signed)
Pt fell a week ago, landing on her bottom.  C/o right leg pain. Pt able to put weight on it.

## 2017-06-19 NOTE — ED Notes (Signed)
Pt fell about one week ago.  History of MS and uses a walker.  Pt says she was reaching for something and lost her balance.  Denies any dizziness.  Did not hit head.  C/o radiating pain to right leg, rates pain 8/10.  Took tylenol at 1000 with mild relief.

## 2017-07-08 ENCOUNTER — Ambulatory Visit (INDEPENDENT_AMBULATORY_CARE_PROVIDER_SITE_OTHER): Payer: Medicare Other | Admitting: Orthopaedic Surgery

## 2017-07-08 ENCOUNTER — Encounter (INDEPENDENT_AMBULATORY_CARE_PROVIDER_SITE_OTHER): Payer: Self-pay | Admitting: Orthopaedic Surgery

## 2017-07-08 VITALS — BP 143/77 | HR 62

## 2017-07-08 DIAGNOSIS — G35 Multiple sclerosis: Secondary | ICD-10-CM | POA: Diagnosis not present

## 2017-07-08 DIAGNOSIS — S32030D Wedge compression fracture of third lumbar vertebra, subsequent encounter for fracture with routine healing: Secondary | ICD-10-CM
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
## 2017-07-30 ENCOUNTER — Other Ambulatory Visit: Payer: Self-pay | Admitting: Family Medicine

## 2017-07-30 MED ORDER — SERTRALINE HCL 100 MG PO TABS: 100.0000 mg | ORAL_TABLET | Freq: Every day | ORAL | 0 refills | Status: DC

## 2017-07-30 MED ORDER — LEVETIRACETAM 500 MG PO TABS: 500.0000 mg | ORAL_TABLET | Freq: Two times a day (BID) | ORAL | 0 refills | Status: DC

## 2017-07-30 NOTE — Telephone Encounter (Signed)
Go ahead and call in 1 month refill both to get her through to the appointment time.

## 2017-07-30 NOTE — Telephone Encounter (Signed)
Please advise if refill will be done before appointment.

## 2017-08-05 ENCOUNTER — Ambulatory Visit (INDEPENDENT_AMBULATORY_CARE_PROVIDER_SITE_OTHER): Payer: Medicare Other | Admitting: Family Medicine

## 2017-08-05 ENCOUNTER — Encounter: Payer: Self-pay | Admitting: Family Medicine

## 2017-08-05 VITALS — BP 132/82 | HR 90 | Temp 98.4°F | Ht 64.0 in | Wt 107.0 lb

## 2017-08-05 DIAGNOSIS — E785 Hyperlipidemia, unspecified: Secondary | ICD-10-CM | POA: Diagnosis not present

## 2017-08-05 DIAGNOSIS — Z23 Encounter for immunization: Secondary | ICD-10-CM

## 2017-08-05 DIAGNOSIS — F419 Anxiety disorder, unspecified: Secondary | ICD-10-CM | POA: Diagnosis not present

## 2017-08-05 DIAGNOSIS — F32A Depression, unspecified: Secondary | ICD-10-CM

## 2017-08-05 DIAGNOSIS — I10 Essential (primary) hypertension: Secondary | ICD-10-CM | POA: Diagnosis not present

## 2017-08-05 DIAGNOSIS — R569 Unspecified convulsions: Secondary | ICD-10-CM

## 2017-08-05 DIAGNOSIS — F329 Major depressive disorder, single episode, unspecified: Secondary | ICD-10-CM

## 2017-08-05 MED ORDER — LEVETIRACETAM 500 MG PO TABS: 500.0000 mg | ORAL_TABLET | Freq: Two times a day (BID) | ORAL | 5 refills | Status: DC

## 2017-08-05 MED ORDER — AMLODIPINE BESYLATE 2.5 MG PO TABS: 2.5000 mg | ORAL_TABLET | Freq: Every day | ORAL | 3 refills | Status: DC

## 2017-08-05 MED ORDER — SERTRALINE HCL 100 MG PO TABS: 100.0000 mg | ORAL_TABLET | Freq: Every day | ORAL | 1 refills | Status: DC

## 2017-08-05 MED ORDER — SERTRALINE HCL 50 MG PO TABS: 50.0000 mg | ORAL_TABLET | Freq: Every morning | ORAL | 1 refills | Status: DC

## 2017-08-05 MED ORDER — PRAVASTATIN SODIUM 10 MG PO TABS: 10.0000 mg | ORAL_TABLET | Freq: Every evening | ORAL | 1 refills | Status: DC

## 2017-08-05 NOTE — Progress Notes (Signed)
BP 132/82   Pulse 90   Temp 98.4 F (36.9 C) (Oral)   Ht _0  (1.626 m)   Wt 107 lb (48.5 kg)   BMI 18.37 kg/m    Subjective:    Patient ID: Christy Joseph, female    DOB: July 03, 1960, 57 y.o.   MRN: [id number]  HPI: Christy Joseph is a 57 y.o. female presenting on 08/05/2017 for Discuss Lisinopril (read several articles recently about patients (especially African Americans) who after long term use develop anaphalactic reactions - this happened to the patient's brother.  Pt. very concerned about continuing med, would like to switch to something else); Fall (on 06/12/17, went to ER, everything ok); and Keppra (was able to get through a program at [facility]. with Denyse Amass.  She now needs you to refill it.)   HPI Hypertension Patient is currently on lisinopril but is concerned about the possible angioedema and has had a cousin who had angioedema and she read an article about African-Americans can have it more commonly would like to be switched her medications, and their blood pressure today is 132/82. Patient denies any lightheadedness or dizziness. Patient denies headaches, blurred vision, chest pains, shortness of breath, or weakness. Denies any side effects from medication and is content with current medication.   Anxiety depression  patient is coming in for anxiety and depression recheck.  She is currently on Zoloft and says that her current dose is working well for her and denies any thoughts of hurting herself or suicide.  She says she has been very happy and would like to continue at the current dose. Depression screen [facility] 2/9 08/05/2017 10/29/2016 09/17/2016 06/11/2016 05/14/2016  Decreased Interest 1 0 0 3 1  Down, Depressed, Hopeless 2 0 0 2 0  PHQ - 2 Score 3 0 0 5 1  Altered sleeping 0 - - 1 -  Tired, decreased energy 1 - - 1 -  Change in appetite 0 - - 1 -  Feeling bad or failure about yourself  1 - - 1 -  Trouble concentrating 0 - - 0 -  Moving slowly or  fidgety/restless 0 - - 0 -  Suicidal thoughts 0 - - 0 -  PHQ-9 Score 5 - - 9 -    Hyperlipidemia Patient is coming in for recheck of his hyperlipidemia. The patient is currently taking pravastatin. They deny any issues with myalgias or history of liver damage from it. They deny any focal numbness or weakness or chest pain.   Seizure medication refill Patient is coming in for a refill of her medication.  She is currently on Keppra for seizures seizure And denies having any seizures in over a year.  She denies any major side effects from the medication is very happy with the medication.  Relevant past medical, surgical, family and social history reviewed and updated as indicated. Interim medical history since our last visit reviewed. Allergies and medications reviewed and updated.  Review of Systems  Constitutional: Negative for chills and fever.  HENT: Negative for congestion, ear discharge and ear pain.   Eyes: Negative for visual disturbance.  Respiratory: Negative for chest tightness and shortness of breath.   Cardiovascular: Negative for chest pain and leg swelling.  Genitourinary: Negative for difficulty urinating and dysuria.  Musculoskeletal: Negative for back pain and gait problem.  Skin: Negative for rash.  Neurological: Negative for dizziness, light-headedness and headaches.  Psychiatric/Behavioral: Negative for agitation, behavioral problems, dysphoric mood, self-injury, sleep  disturbance and suicidal ideas. The patient is nervous/anxious.   All other systems reviewed and are negative.   Per HPI unless specifically indicated above   Allergies as of 08/05/2017   No Known Allergies     Medication List        Accurate as of 08/05/17 12:14 PM. Always use your most recent med list.          amLODipine 2.5 MG tablet Commonly known as:  NORVASC Take 1 tablet (2.5 mg total) by mouth daily.   aspirin 325 MG tablet Take 1 tablet (325 mg total) by mouth daily.   Calcium +  D3 600-200 MG-UNIT Tabs Take 1 tablet by mouth daily.   docusate sodium 100 MG capsule Commonly known as:  COLACE Take 1 capsule (100 mg total) by mouth every 12 (twelve) hours.   HYDROcodone-acetaminophen 5-325 MG tablet Commonly known as:  NORCO/VICODIN Take 1 tablet by mouth every 4 (four) hours as needed.   ibuprofen 400 MG tablet Commonly known as:  ADVIL,MOTRIN Take 1 tablet (400 mg total) by mouth every 6 (six) hours as needed.   levETIRAcetam 500 MG tablet Commonly known as:  KEPPRA Take 1 tablet (500 mg total) by mouth 2 (two) times daily.   methocarbamol 500 MG tablet Commonly known as:  ROBAXIN Take 1 tablet (500 mg total) by mouth 3 (three) times daily.   pravastatin 10 MG tablet Commonly known as:  PRAVACHOL Take 1 tablet (10 mg total) by mouth every evening.   sertraline 50 MG tablet Commonly known as:  ZOLOFT Take 1 tablet (50 mg total) by mouth every morning.   sertraline 100 MG tablet Commonly known as:  ZOLOFT Take 1 tablet (100 mg total) by mouth daily.   Vitamin D3 5000 units Tabs Take 1 tablet (5,000 Units total) by mouth daily.          Objective:    BP 132/82   Pulse 90   Temp 98.4 F (36.9 C) (Oral)   Ht _0  (1.626 m)   Wt 107 lb (48.5 kg)   BMI 18.37 kg/m   Wt Readings from Last 3 Encounters:  08/05/17 107 lb (48.5 kg)  06/19/17 108 lb (49 kg)  10/29/16 105 lb (47.6 kg)    Physical Exam  Constitutional: She is oriented to person, place, and time. She appears well-developed and well-nourished. No distress.  Eyes: Conjunctivae are normal.  Neck: Neck supple. No thyromegaly present.  Cardiovascular: Normal rate, regular rhythm, normal heart sounds and intact distal pulses.  No murmur heard. Pulmonary/Chest: Effort normal and breath sounds normal. No respiratory distress. She has no wheezes. She has no rales.  Musculoskeletal: Normal range of motion. She exhibits no edema.  Lymphadenopathy:    She has no cervical adenopathy.    Neurological: She is alert and oriented to person, place, and time.  Skin: Skin is warm and dry. No rash noted. She is not diaphoretic.  Psychiatric: She has a normal mood and affect. Her behavior is normal.  Nursing note and vitals reviewed.       Assessment & Plan:   Problem List Items Addressed This Visit      Cardiovascular and Mediastinum   Essential hypertension, benign   Relevant Medications   amLODipine (NORVASC) 2.5 MG tablet   pravastatin (PRAVACHOL) 10 MG tablet   Other Relevant Orders   CMP14+EGFR (Completed)     Other   Hyperlipidemia LDL goal <130 - Primary   Relevant Medications   amLODipine (NORVASC)  2.5 MG tablet   pravastatin (PRAVACHOL) 10 MG tablet   Other Relevant Orders   Lipid panel (Completed)   Anxiety and depression   Relevant Medications   sertraline (ZOLOFT) 50 MG tablet   sertraline (ZOLOFT) 100 MG tablet   Other Relevant Orders   CBC with Differential/Platelet (Completed)   Seizure ([place])   Relevant Medications   levETIRAcetam (KEPPRA) 500 MG tablet   Other Relevant Orders   CBC with Differential/Platelet (Completed)    Other Visit Diagnoses    Need for immunization against influenza       Relevant Orders   Flu Vaccine QUAD 36+ mos IM (Completed)       Follow up plan: Return in about 6 months (around 02/05/2018), or if symptoms worsen or fail to improve, for htn hld, depression.  Counseling provided for all of the vaccine components Orders Placed This Encounter  Procedures  . CBC with Differential/Platelet  . CMP14+EGFR  . Lipid panel    Caryl Pina, MD [facility] 08/05/2017, 12:14 PM

## 2017-08-06 LAB — CBC WITH DIFFERENTIAL/PLATELET
BASOS: 0 %
Basophils Absolute: 0 10*3/uL (ref 0.0–0.2)
EOS (ABSOLUTE): 0.1 10*3/uL (ref 0.0–0.4)
EOS: 1 %
HEMOGLOBIN: 13.1 g/dL (ref 11.1–15.9)
Hematocrit: 39.9 % (ref 34.0–46.6)
IMMATURE GRANS (ABS): 0 10*3/uL (ref 0.0–0.1)
Immature Granulocytes: 0 %
LYMPHS: 39 %
Lymphocytes Absolute: 3.9 10*3/uL — ABNORMAL HIGH (ref 0.7–3.1)
MCH: 33.7 pg — AB (ref 26.6–33.0)
MCHC: 32.8 g/dL (ref 31.5–35.7)
MCV: 103 fL — AB (ref 79–97)
MONOCYTES: 4 %
Monocytes Absolute: 0.4 10*3/uL (ref 0.1–0.9)
NEUTROS ABS: 5.4 10*3/uL (ref 1.4–7.0)
Neutrophils: 56 %
Platelets: 339 10*3/uL (ref 150–379)
RBC: 3.89 x10E6/uL (ref 3.77–5.28)
RDW: 15.3 % (ref 12.3–15.4)
WBC: 9.8 10*3/uL (ref 3.4–10.8)

## 2017-08-06 LAB — CMP14+EGFR
A/G RATIO: 2.4 — AB (ref 1.2–2.2)
ALBUMIN: 4.7 g/dL (ref 3.5–5.5)
ALT: 8 IU/L (ref 0–32)
AST: 9 IU/L (ref 0–40)
Alkaline Phosphatase: 83 IU/L (ref 39–117)
BUN/Creatinine Ratio: 13 (ref 9–23)
BUN: 11 mg/dL (ref 6–24)
CHLORIDE: 105 mmol/L (ref 96–106)
CO2: 29 mmol/L (ref 20–29)
Calcium: 10.6 mg/dL — ABNORMAL HIGH (ref 8.7–10.2)
Creatinine, Ser: 0.83 mg/dL (ref 0.57–1.00)
GFR, EST AFRICAN AMERICAN: 91 mL/min/{1.73_m2} (ref >59)
GFR, EST NON AFRICAN AMERICAN: 79 mL/min/{1.73_m2} (ref >59)
Globulin, Total: 2 g/dL (ref 1.5–4.5)
Glucose: 76 mg/dL (ref 65–99)
POTASSIUM: 4.8 mmol/L (ref 3.5–5.2)
Sodium: 148 mmol/L — ABNORMAL HIGH (ref 134–144)
TOTAL PROTEIN: 6.7 g/dL (ref 6.0–8.5)

## 2017-08-06 LAB — LIPID PANEL
CHOL/HDL RATIO: 2.9 ratio (ref 0.0–4.4)
Cholesterol, Total: 149 mg/dL (ref 100–199)
HDL: 52 mg/dL (ref >39)
LDL Calculated: 79 mg/dL (ref 0–99)
Triglycerides: 89 mg/dL (ref 0–149)
VLDL CHOLESTEROL CAL: 18 mg/dL (ref 5–40)

## 2017-08-31 ENCOUNTER — Telehealth: Payer: Self-pay | Admitting: Family Medicine

## 2017-08-31 NOTE — Telephone Encounter (Signed)
Please review and advise.

## 2017-08-31 NOTE — Telephone Encounter (Signed)
Yes she is in no state to be able to do this.  Go ahead and do a letter for her.

## 2017-09-01 NOTE — Telephone Encounter (Signed)
Letter wrote. Patient aware 

## 2017-09-02 DIAGNOSIS — H43813 Vitreous degeneration, bilateral: Secondary | ICD-10-CM | POA: Diagnosis not present

## 2017-09-02 DIAGNOSIS — Z961 Presence of intraocular lens: Secondary | ICD-10-CM | POA: Diagnosis not present

## 2017-09-02 DIAGNOSIS — H26492 Other secondary cataract, left eye: Secondary | ICD-10-CM | POA: Diagnosis not present

## 2017-09-02 DIAGNOSIS — H04123 Dry eye syndrome of bilateral lacrimal glands: Secondary | ICD-10-CM | POA: Diagnosis not present

## 2017-09-16 DIAGNOSIS — H26492 Other secondary cataract, left eye: Secondary | ICD-10-CM | POA: Diagnosis not present

## 2017-09-27 DIAGNOSIS — Z8673 Personal history of transient ischemic attack (TIA), and cerebral infarction without residual deficits: Secondary | ICD-10-CM | POA: Diagnosis not present

## 2017-09-27 DIAGNOSIS — G40909 Epilepsy, unspecified, not intractable, without status epilepticus: Secondary | ICD-10-CM | POA: Diagnosis not present

## 2017-09-27 DIAGNOSIS — Z79899 Other long term (current) drug therapy: Secondary | ICD-10-CM | POA: Diagnosis not present

## 2017-09-27 DIAGNOSIS — G35 Multiple sclerosis: Secondary | ICD-10-CM | POA: Diagnosis not present

## 2017-09-27 DIAGNOSIS — F329 Major depressive disorder, single episode, unspecified: Secondary | ICD-10-CM | POA: Diagnosis not present

## 2017-09-27 DIAGNOSIS — F17210 Nicotine dependence, cigarettes, uncomplicated: Secondary | ICD-10-CM | POA: Diagnosis not present

## 2017-09-27 DIAGNOSIS — Z7982 Long term (current) use of aspirin: Secondary | ICD-10-CM | POA: Diagnosis not present

## 2017-09-30 DIAGNOSIS — H26492 Other secondary cataract, left eye: Secondary | ICD-10-CM | POA: Diagnosis not present

## 2017-10-04 DIAGNOSIS — R930 Abnormal findings on diagnostic imaging of skull and head, not elsewhere classified: Secondary | ICD-10-CM | POA: Diagnosis not present

## 2017-10-04 DIAGNOSIS — I6502 Occlusion and stenosis of left vertebral artery: Secondary | ICD-10-CM | POA: Diagnosis not present

## 2017-10-04 DIAGNOSIS — G35 Multiple sclerosis: Secondary | ICD-10-CM | POA: Diagnosis not present

## 2017-10-14 DIAGNOSIS — H2013 Chronic iridocyclitis, bilateral: Secondary | ICD-10-CM | POA: Diagnosis not present

## 2017-10-24 ENCOUNTER — Emergency Department (HOSPITAL_COMMUNITY)
Admission: EM | Admit: 2017-10-24 | Discharge: 2017-10-24 | Disposition: A | Discharge status: Home / Self Care | Payer: Medicare Other | Attending: Emergency Medicine | Admitting: Emergency Medicine

## 2017-10-24 ENCOUNTER — Encounter (HOSPITAL_COMMUNITY): Payer: Self-pay | Admitting: Emergency Medicine

## 2017-10-24 ENCOUNTER — Emergency Department (HOSPITAL_COMMUNITY): Payer: Medicare Other

## 2017-10-24 DIAGNOSIS — S3992XA Unspecified injury of lower back, initial encounter: Secondary | ICD-10-CM | POA: Diagnosis not present

## 2017-10-24 DIAGNOSIS — Z8673 Personal history of transient ischemic attack (TIA), and cerebral infarction without residual deficits: Secondary | ICD-10-CM | POA: Diagnosis not present

## 2017-10-24 DIAGNOSIS — W19XXXA Unspecified fall, initial encounter: Secondary | ICD-10-CM

## 2017-10-24 DIAGNOSIS — W06XXXA Fall from bed, initial encounter: Secondary | ICD-10-CM | POA: Insufficient documentation

## 2017-10-24 DIAGNOSIS — M79622 Pain in left upper arm: Secondary | ICD-10-CM | POA: Insufficient documentation

## 2017-10-24 DIAGNOSIS — M546 Pain in thoracic spine: Secondary | ICD-10-CM | POA: Diagnosis not present

## 2017-10-24 DIAGNOSIS — S4992XA Unspecified injury of left shoulder and upper arm, initial encounter: Secondary | ICD-10-CM | POA: Diagnosis not present

## 2017-10-24 DIAGNOSIS — T148XXA Other injury of unspecified body region, initial encounter: Secondary | ICD-10-CM | POA: Diagnosis not present

## 2017-10-24 DIAGNOSIS — M545 Low back pain: Secondary | ICD-10-CM | POA: Diagnosis not present

## 2017-10-24 DIAGNOSIS — F17210 Nicotine dependence, cigarettes, uncomplicated: Secondary | ICD-10-CM | POA: Insufficient documentation

## 2017-10-24 DIAGNOSIS — I10 Essential (primary) hypertension: Secondary | ICD-10-CM | POA: Diagnosis not present

## 2017-10-24 MED ORDER — HYDROMORPHONE HCL 1 MG/ML IJ SOLN
1.0000 mg | Freq: Once | INTRAMUSCULAR | Status: AC
  Administered 2017-10-24: 1 mg via INTRAVENOUS
  Filled 2017-10-24: qty 1

## 2017-10-24 MED ORDER — ONDANSETRON HCL 4 MG/2ML IJ SOLN
4.0000 mg | Freq: Once | INTRAMUSCULAR | Status: AC
  Administered 2017-10-24: 4 mg via INTRAVENOUS
  Filled 2017-10-24: qty 2

## 2017-10-24 MED ORDER — KETOROLAC TROMETHAMINE 30 MG/ML IJ SOLN
30.0000 mg | Freq: Once | INTRAMUSCULAR | Status: AC
  Administered 2017-10-24: 30 mg via INTRAVENOUS
  Filled 2017-10-24: qty 1

## 2017-10-24 MED ORDER — OXYCODONE-ACETAMINOPHEN 5-325 MG PO TABS: 1.0000 | ORAL_TABLET | ORAL | 0 refills | Status: DC | PRN

## 2017-10-24 NOTE — Discharge Instructions (Addendum)
Use a walker to ambulate with.  Follow-up with your family doctor this week for recheck
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
## 2017-10-24 NOTE — ED Notes (Signed)
Patient transported to X-ray 

## 2017-10-24 NOTE — ED Triage Notes (Signed)
Pt reports reaching for something off of the bed and fell, hitting the floor.  Pt c/o left arm and lower back pain.

## 2017-10-27 ENCOUNTER — Emergency Department (HOSPITAL_COMMUNITY)
Admission: EM | Admit: 2017-10-27 | Discharge: 2017-10-27 | Disposition: A | Discharge status: Home / Self Care | Payer: Medicare Other | Attending: Emergency Medicine | Admitting: Emergency Medicine

## 2017-10-27 ENCOUNTER — Emergency Department (HOSPITAL_COMMUNITY): Payer: Medicare Other

## 2017-10-27 ENCOUNTER — Encounter (HOSPITAL_COMMUNITY): Payer: Self-pay

## 2017-10-27 DIAGNOSIS — R0789 Other chest pain: Secondary | ICD-10-CM | POA: Insufficient documentation

## 2017-10-27 DIAGNOSIS — S79912A Unspecified injury of left hip, initial encounter: Secondary | ICD-10-CM | POA: Insufficient documentation

## 2017-10-27 DIAGNOSIS — M545 Low back pain: Secondary | ICD-10-CM | POA: Diagnosis not present

## 2017-10-27 DIAGNOSIS — Z79899 Other long term (current) drug therapy: Secondary | ICD-10-CM | POA: Insufficient documentation

## 2017-10-27 DIAGNOSIS — M546 Pain in thoracic spine: Secondary | ICD-10-CM | POA: Diagnosis not present

## 2017-10-27 DIAGNOSIS — Y999 Unspecified external cause status: Secondary | ICD-10-CM | POA: Insufficient documentation

## 2017-10-27 DIAGNOSIS — F17210 Nicotine dependence, cigarettes, uncomplicated: Secondary | ICD-10-CM | POA: Insufficient documentation

## 2017-10-27 DIAGNOSIS — M25552 Pain in left hip: Secondary | ICD-10-CM | POA: Diagnosis not present

## 2017-10-27 DIAGNOSIS — S3992XA Unspecified injury of lower back, initial encounter: Secondary | ICD-10-CM | POA: Diagnosis not present

## 2017-10-27 DIAGNOSIS — S4992XA Unspecified injury of left shoulder and upper arm, initial encounter: Secondary | ICD-10-CM | POA: Diagnosis not present

## 2017-10-27 DIAGNOSIS — Z7982 Long term (current) use of aspirin: Secondary | ICD-10-CM | POA: Insufficient documentation

## 2017-10-27 DIAGNOSIS — R296 Repeated falls: Secondary | ICD-10-CM | POA: Diagnosis not present

## 2017-10-27 DIAGNOSIS — N390 Urinary tract infection, site not specified: Secondary | ICD-10-CM | POA: Diagnosis not present

## 2017-10-27 DIAGNOSIS — M25512 Pain in left shoulder: Secondary | ICD-10-CM | POA: Diagnosis not present

## 2017-10-27 DIAGNOSIS — W19XXXA Unspecified fall, initial encounter: Secondary | ICD-10-CM | POA: Insufficient documentation

## 2017-10-27 DIAGNOSIS — S0990XA Unspecified injury of head, initial encounter: Secondary | ICD-10-CM | POA: Diagnosis not present

## 2017-10-27 DIAGNOSIS — I10 Essential (primary) hypertension: Secondary | ICD-10-CM | POA: Diagnosis not present

## 2017-10-27 DIAGNOSIS — T07XXXA Unspecified multiple injuries, initial encounter: Secondary | ICD-10-CM

## 2017-10-27 DIAGNOSIS — T148XXA Other injury of unspecified body region, initial encounter: Secondary | ICD-10-CM | POA: Diagnosis not present

## 2017-10-27 DIAGNOSIS — G35 Multiple sclerosis: Secondary | ICD-10-CM | POA: Insufficient documentation

## 2017-10-27 DIAGNOSIS — S0003XA Contusion of scalp, initial encounter: Secondary | ICD-10-CM | POA: Diagnosis not present

## 2017-10-27 DIAGNOSIS — Y92019 Unspecified place in single-family (private) house as the place of occurrence of the external cause: Secondary | ICD-10-CM | POA: Insufficient documentation

## 2017-10-27 DIAGNOSIS — S199XXA Unspecified injury of neck, initial encounter: Secondary | ICD-10-CM | POA: Diagnosis not present

## 2017-10-27 DIAGNOSIS — S300XXA Contusion of lower back and pelvis, initial encounter: Secondary | ICD-10-CM | POA: Diagnosis not present

## 2017-10-27 DIAGNOSIS — R52 Pain, unspecified: Secondary | ICD-10-CM | POA: Diagnosis not present

## 2017-10-27 DIAGNOSIS — Y939 Activity, unspecified: Secondary | ICD-10-CM | POA: Insufficient documentation

## 2017-10-27 DIAGNOSIS — S7002XA Contusion of left hip, initial encounter: Secondary | ICD-10-CM | POA: Diagnosis not present

## 2017-10-27 DIAGNOSIS — S40012A Contusion of left shoulder, initial encounter: Secondary | ICD-10-CM | POA: Diagnosis not present

## 2017-10-27 LAB — URINALYSIS, ROUTINE W REFLEX MICROSCOPIC
Bilirubin Urine: NEGATIVE
Glucose, UA: NEGATIVE mg/dL
Ketones, ur: NEGATIVE mg/dL
LEUKOCYTES UA: NEGATIVE
Nitrite: POSITIVE — AB
Protein, ur: NEGATIVE mg/dL
SPECIFIC GRAVITY, URINE: 1.016 (ref 1.005–1.030)
pH: 7 (ref 5.0–8.0)

## 2017-10-27 MED ORDER — ONDANSETRON HCL 4 MG PO TABS
4.0000 mg | ORAL_TABLET | Freq: Once | ORAL | Status: AC
  Administered 2017-10-27: 4 mg via ORAL
  Filled 2017-10-27: qty 1

## 2017-10-27 MED ORDER — OXYCODONE-ACETAMINOPHEN 5-325 MG PO TABS
1.0000 | ORAL_TABLET | Freq: Once | ORAL | Status: AC
  Administered 2017-10-27: 1 via ORAL
  Filled 2017-10-27: qty 1

## 2017-10-27 MED ORDER — CEPHALEXIN 500 MG PO CAPS: 500.0000 mg | ORAL_CAPSULE | Freq: Four times a day (QID) | ORAL | 0 refills | Status: DC

## 2017-10-27 MED ORDER — CEPHALEXIN 500 MG PO CAPS
500.0000 mg | ORAL_CAPSULE | Freq: Once | ORAL | Status: AC
  Administered 2017-10-27: 500 mg via ORAL
  Filled 2017-10-27: qty 1

## 2017-10-27 NOTE — ED Triage Notes (Signed)
Pt reports that she fell approx 9pm. Reports that she was ambulating with cane and lost balance due to MS. States she fell on back and hit head. Family had to help her to bed. Denies LOC. Reports pain in lower back and left shoulder

## 2017-10-27 NOTE — Discharge Instructions (Addendum)
The x-ray of the shoulder, hip, pelvis, and lower back were negative for acute fracture or dislocation.  The CT scan of the head and neck were negative for fracture, intracerebral abnormality, or dislocation.  The urine tests questions a urinary tract infection.  A culture has been sent to the lab.  Please use Keflex 4 times daily with food if possible.  Please schedule an appointment as soon as possible with Dr. Renne Crigler to discuss the falls and the changes in your walking.  Please return to the emergency department if any emergent changes in your condition, or new problems.
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
## 2017-10-28 ENCOUNTER — Encounter: Payer: Self-pay | Admitting: Family Medicine

## 2017-10-28 ENCOUNTER — Ambulatory Visit (INDEPENDENT_AMBULATORY_CARE_PROVIDER_SITE_OTHER): Payer: Medicare Other | Admitting: Family Medicine

## 2017-10-28 VITALS — BP 137/81 | HR 76 | Temp 100.2°F | Ht 64.0 in | Wt 105.2 lb

## 2017-10-28 DIAGNOSIS — R531 Weakness: Secondary | ICD-10-CM | POA: Diagnosis not present

## 2017-10-28 DIAGNOSIS — N611 Abscess of the breast and nipple: Secondary | ICD-10-CM

## 2017-10-28 DIAGNOSIS — R296 Repeated falls: Secondary | ICD-10-CM | POA: Diagnosis not present

## 2017-10-28 LAB — LEVETIRACETAM LEVEL: Levetiracetam Lvl: 10.8 ug/mL (ref 10.0–40.0)

## 2017-10-28 NOTE — Progress Notes (Signed)
BP 137/81   Pulse 76   Temp 100.2 F (37.9 C) (Oral)   Ht 5\' 4"  (1.626 m)   Wt 105 lb 4 oz (47.7 kg)   BMI 18.07 kg/m    Subjective:    Patient ID: Christy Joseph, female    DOB: Aug 31, 1960, 57 y.o.   MRN: [id number]  HPI: Christy Joseph is a 57 y.o. female presenting on 10/28/2017 for ER follow up (3 falls recently, wants to discuss placement for rehab) and Has had a boil underneath breast that recently burst   HPI Hospital follow-up Patient is coming in for hospital follow-up for generalized weakness and recurrent falls.  She has been to the emergency department on 10/27/2017 which was yesterday and 10/24/2017 for 2 different falls.  She has had imaging and scans that did not find any fractures.  She says most of her pain that she has residual from that is in her lower back, worse on the right lower back than the left.  She denies any numbness or loss of bowel or bladder.  She generally just feels like she is been weaker and weaker and that is why she is falling more often.  She says that the accidents that make her fall but it seems like her strength is not enough to keep her from falling.  Patient has also developed a small abscess near the base or bottom of her right breast started Keflex yesterday for a UTI.  She denies any fevers or chills or redness or warmth.  She says the drainage has been somewhat clear but also sometimes purulent.  She used to use a cane and then now she is limited to mostly walker and today in the office she is weak enough that she is brought in wheelchair.  Patient does have a diagnosis of multiple sclerosis which may attribute to some of her progressive weakness.  She did just have a recent MRI that looked stable.  Relevant past medical, surgical, family and social history reviewed and updated as indicated. Interim medical history since our last visit reviewed. Allergies and medications reviewed and updated.  Review of Systems  Constitutional: Negative for  chills and fever.  Eyes: Negative for visual disturbance.  Respiratory: Negative for chest tightness and shortness of breath.   Cardiovascular: Negative for chest pain and leg swelling.  Musculoskeletal: Positive for arthralgias and back pain. Negative for gait problem, joint swelling, myalgias and neck pain.  Skin: Negative for rash.  Neurological: Positive for weakness. Negative for dizziness, light-headedness, numbness and headaches.  Psychiatric/Behavioral: Negative for agitation and behavioral problems.  All other systems reviewed and are negative.   Per HPI unless specifically indicated above   Allergies as of 10/28/2017   No Known Allergies     Medication List        Accurate as of 10/28/17  2:48 PM. Always use your most recent med list.          acetaminophen 500 MG tablet Commonly known as:  TYLENOL Take 500-1,000 mg by mouth daily as needed for mild pain or moderate pain.   amLODipine 2.5 MG tablet Commonly known as:  NORVASC Take 1 tablet (2.5 mg total) by mouth daily.   aspirin 325 MG tablet Take 1 tablet (325 mg total) by mouth daily.   Calcium + D3 600-200 MG-UNIT Tabs Take 1 tablet by mouth daily.   cephALEXin 500 MG capsule Commonly known as:  KEFLEX Take 1 capsule (500 mg total) by mouth 4 (  four) times daily.   Dimethyl Fumarate 120 & 240 MG Misc Take 120mg  by mouth twice daily for 7 days, then increase to 240mg  twice daily.   docusate sodium 100 MG capsule Commonly known as:  COLACE Take 1 capsule (100 mg total) by mouth every 12 (twelve) hours.   ibuprofen 400 MG tablet Commonly known as:  ADVIL,MOTRIN Take 400 mg by mouth every 6 (six) hours as needed.   levETIRAcetam 500 MG tablet Commonly known as:  KEPPRA Take 1 tablet (500 mg total) by mouth 2 (two) times daily.   oxyCODONE-acetaminophen 5-325 MG tablet Commonly known as:  PERCOCET/ROXICET Take 1-2 tablets by mouth every 4 (four) hours as needed for severe pain.   pravastatin 10 MG  tablet Commonly known as:  PRAVACHOL Take 1 tablet (10 mg total) by mouth every evening.   sertraline 50 MG tablet Commonly known as:  ZOLOFT Take 1 tablet (50 mg total) by mouth every morning.   sertraline 100 MG tablet Commonly known as:  ZOLOFT Take 1 tablet (100 mg total) by mouth daily.   Vitamin D3 5000 units Tabs Take 1 tablet (5,000 Units total) by mouth daily.          Objective:    BP 137/81   Pulse 76   Temp 100.2 F (37.9 C) (Oral)   Ht 5\' 4"  (1.626 m)   Wt 105 lb 4 oz (47.7 kg)   BMI 18.07 kg/m   Wt Readings from Last 3 Encounters:  10/28/17 105 lb 4 oz (47.7 kg)  10/27/17 107 lb (48.5 kg)  10/24/17 107 lb (48.5 kg)    Physical Exam  Constitutional: She is oriented to person, place, and time. She appears well-developed and well-nourished. No distress.  Eyes: Pupils are equal, round, and reactive to light. Conjunctivae and EOM are normal.  Neck: Neck supple. No thyromegaly present.  Cardiovascular: Normal rate, regular rhythm, normal heart sounds and intact distal pulses.  No murmur heard. Pulmonary/Chest: Effort normal and breath sounds normal. No respiratory distress. She has no wheezes.  Musculoskeletal: Normal range of motion. She exhibits no edema.       Lumbar back: She exhibits tenderness. She exhibits normal range of motion and no bony tenderness.       Back:  Lymphadenopathy:    She has no cervical adenopathy.  Neurological: She is alert and oriented to person, place, and time. Coordination normal.  Skin: Skin is warm and dry. No rash noted. She is not diaphoretic.  Psychiatric: She has a normal mood and affect. Her behavior is normal.  Nursing note and vitals reviewed.       Assessment & Plan:   Problem List Items Addressed This Visit    None    Visit Diagnoses    Recurrent falls    -  Primary   Abscess of right breast       Generalized weakness          Patient just started Keflex yesterday for UTI, this should cover the breast  abscess as well  Patient has had recurrent falls and generalized weakness and has progressively worsening, very likely could be related to her MS.  Patient wants to go to rehab to see if they can get her strength back up to get her home in stable again.  We went ahead and send an FL2 for [place]  Follow up plan: Return if symptoms worsen or fail to improve.  Counseling provided for all of the vaccine components No  orders of the defined types were placed in this encounter.   Arville Care, MD [facility] 10/28/2017, 2:48 PM

## 2017-10-30 LAB — URINE CULTURE

## 2017-11-02 DIAGNOSIS — L89159 Pressure ulcer of sacral region, unspecified stage: Secondary | ICD-10-CM | POA: Diagnosis not present

## 2017-11-02 DIAGNOSIS — Y999 Unspecified external cause status: Secondary | ICD-10-CM | POA: Diagnosis not present

## 2017-11-02 DIAGNOSIS — S21001A Unspecified open wound of right breast, initial encounter: Secondary | ICD-10-CM | POA: Diagnosis not present

## 2017-11-02 DIAGNOSIS — M810 Age-related osteoporosis without current pathological fracture: Secondary | ICD-10-CM | POA: Diagnosis not present

## 2017-11-02 DIAGNOSIS — R930 Abnormal findings on diagnostic imaging of skull and head, not elsewhere classified: Secondary | ICD-10-CM | POA: Diagnosis not present

## 2017-11-02 DIAGNOSIS — I10 Essential (primary) hypertension: Secondary | ICD-10-CM | POA: Diagnosis not present

## 2017-11-02 DIAGNOSIS — Z8673 Personal history of transient ischemic attack (TIA), and cerebral infarction without residual deficits: Secondary | ICD-10-CM | POA: Diagnosis not present

## 2017-11-02 DIAGNOSIS — M25552 Pain in left hip: Secondary | ICD-10-CM | POA: Diagnosis not present

## 2017-11-02 DIAGNOSIS — W1839XA Other fall on same level, initial encounter: Secondary | ICD-10-CM | POA: Diagnosis not present

## 2017-11-02 DIAGNOSIS — S32122A Severely displaced Zone II fracture of sacrum, initial encounter for closed fracture: Secondary | ICD-10-CM | POA: Diagnosis not present

## 2017-11-02 DIAGNOSIS — Z681 Body mass index (BMI) 19 or less, adult: Secondary | ICD-10-CM | POA: Diagnosis not present

## 2017-11-02 DIAGNOSIS — N611 Abscess of the breast and nipple: Secondary | ICD-10-CM | POA: Diagnosis not present

## 2017-11-02 DIAGNOSIS — K5900 Constipation, unspecified: Secondary | ICD-10-CM | POA: Diagnosis not present

## 2017-11-02 DIAGNOSIS — M549 Dorsalgia, unspecified: Secondary | ICD-10-CM | POA: Diagnosis not present

## 2017-11-02 DIAGNOSIS — G35 Multiple sclerosis: Secondary | ICD-10-CM | POA: Diagnosis not present

## 2017-11-02 DIAGNOSIS — I639 Cerebral infarction, unspecified: Secondary | ICD-10-CM | POA: Diagnosis not present

## 2017-11-02 DIAGNOSIS — I63511 Cerebral infarction due to unspecified occlusion or stenosis of right middle cerebral artery: Secondary | ICD-10-CM | POA: Diagnosis not present

## 2017-11-03 DIAGNOSIS — Z7983 Long term (current) use of bisphosphonates: Secondary | ICD-10-CM | POA: Diagnosis not present

## 2017-11-03 DIAGNOSIS — M25552 Pain in left hip: Secondary | ICD-10-CM | POA: Diagnosis not present

## 2017-11-03 DIAGNOSIS — R569 Unspecified convulsions: Secondary | ICD-10-CM | POA: Diagnosis not present

## 2017-11-03 DIAGNOSIS — I6502 Occlusion and stenosis of left vertebral artery: Secondary | ICD-10-CM | POA: Diagnosis present

## 2017-11-03 DIAGNOSIS — I63411 Cerebral infarction due to embolism of right middle cerebral artery: Secondary | ICD-10-CM | POA: Diagnosis not present

## 2017-11-03 DIAGNOSIS — S3210XA Unspecified fracture of sacrum, initial encounter for closed fracture: Secondary | ICD-10-CM | POA: Diagnosis not present

## 2017-11-03 DIAGNOSIS — L89159 Pressure ulcer of sacral region, unspecified stage: Secondary | ICD-10-CM | POA: Diagnosis present

## 2017-11-03 DIAGNOSIS — Z8673 Personal history of transient ischemic attack (TIA), and cerebral infarction without residual deficits: Secondary | ICD-10-CM | POA: Diagnosis not present

## 2017-11-03 DIAGNOSIS — M810 Age-related osteoporosis without current pathological fracture: Secondary | ICD-10-CM | POA: Diagnosis present

## 2017-11-03 DIAGNOSIS — M4854XA Collapsed vertebra, not elsewhere classified, thoracic region, initial encounter for fracture: Secondary | ICD-10-CM | POA: Diagnosis not present

## 2017-11-03 DIAGNOSIS — Z743 Need for continuous supervision: Secondary | ICD-10-CM | POA: Diagnosis not present

## 2017-11-03 DIAGNOSIS — R9089 Other abnormal findings on diagnostic imaging of central nervous system: Secondary | ICD-10-CM | POA: Diagnosis not present

## 2017-11-03 DIAGNOSIS — I10 Essential (primary) hypertension: Secondary | ICD-10-CM | POA: Diagnosis present

## 2017-11-03 DIAGNOSIS — Z681 Body mass index (BMI) 19 or less, adult: Secondary | ICD-10-CM | POA: Diagnosis not present

## 2017-11-03 DIAGNOSIS — K5900 Constipation, unspecified: Secondary | ICD-10-CM | POA: Diagnosis present

## 2017-11-03 DIAGNOSIS — Z7982 Long term (current) use of aspirin: Secondary | ICD-10-CM | POA: Diagnosis not present

## 2017-11-03 DIAGNOSIS — G4089 Other seizures: Secondary | ICD-10-CM | POA: Diagnosis not present

## 2017-11-03 DIAGNOSIS — G629 Polyneuropathy, unspecified: Secondary | ICD-10-CM | POA: Diagnosis not present

## 2017-11-03 DIAGNOSIS — M545 Low back pain: Secondary | ICD-10-CM | POA: Diagnosis not present

## 2017-11-03 DIAGNOSIS — R2689 Other abnormalities of gait and mobility: Secondary | ICD-10-CM | POA: Diagnosis not present

## 2017-11-03 DIAGNOSIS — I6521 Occlusion and stenosis of right carotid artery: Secondary | ICD-10-CM | POA: Diagnosis present

## 2017-11-03 DIAGNOSIS — S30820A Blister (nonthermal) of lower back and pelvis, initial encounter: Secondary | ICD-10-CM | POA: Diagnosis present

## 2017-11-03 DIAGNOSIS — Z7409 Other reduced mobility: Secondary | ICD-10-CM | POA: Diagnosis not present

## 2017-11-03 DIAGNOSIS — R636 Underweight: Secondary | ICD-10-CM | POA: Diagnosis present

## 2017-11-03 DIAGNOSIS — R296 Repeated falls: Secondary | ICD-10-CM | POA: Diagnosis present

## 2017-11-03 DIAGNOSIS — Z791 Long term (current) use of non-steroidal anti-inflammatories (NSAID): Secondary | ICD-10-CM | POA: Diagnosis not present

## 2017-11-03 DIAGNOSIS — S21001A Unspecified open wound of right breast, initial encounter: Secondary | ICD-10-CM | POA: Diagnosis not present

## 2017-11-03 DIAGNOSIS — I6789 Other cerebrovascular disease: Secondary | ICD-10-CM | POA: Diagnosis not present

## 2017-11-03 DIAGNOSIS — I517 Cardiomegaly: Secondary | ICD-10-CM | POA: Diagnosis not present

## 2017-11-03 DIAGNOSIS — T148XXA Other injury of unspecified body region, initial encounter: Secondary | ICD-10-CM | POA: Diagnosis not present

## 2017-11-03 DIAGNOSIS — N611 Abscess of the breast and nipple: Secondary | ICD-10-CM | POA: Diagnosis present

## 2017-11-03 DIAGNOSIS — S3219XA Other fracture of sacrum, initial encounter for closed fracture: Secondary | ICD-10-CM | POA: Diagnosis not present

## 2017-11-03 DIAGNOSIS — Z79899 Other long term (current) drug therapy: Secondary | ICD-10-CM | POA: Diagnosis not present

## 2017-11-03 DIAGNOSIS — G40909 Epilepsy, unspecified, not intractable, without status epilepticus: Secondary | ICD-10-CM | POA: Diagnosis present

## 2017-11-03 DIAGNOSIS — R9431 Abnormal electrocardiogram [ECG] [EKG]: Secondary | ICD-10-CM | POA: Diagnosis not present

## 2017-11-03 DIAGNOSIS — S32129A Unspecified Zone II fracture of sacrum, initial encounter for closed fracture: Secondary | ICD-10-CM | POA: Diagnosis not present

## 2017-11-03 DIAGNOSIS — I639 Cerebral infarction, unspecified: Secondary | ICD-10-CM | POA: Diagnosis not present

## 2017-11-03 DIAGNOSIS — F329 Major depressive disorder, single episode, unspecified: Secondary | ICD-10-CM | POA: Diagnosis not present

## 2017-11-03 DIAGNOSIS — S32122A Severely displaced Zone II fracture of sacrum, initial encounter for closed fracture: Secondary | ICD-10-CM | POA: Diagnosis present

## 2017-11-03 DIAGNOSIS — M6281 Muscle weakness (generalized): Secondary | ICD-10-CM | POA: Diagnosis not present

## 2017-11-03 DIAGNOSIS — I6389 Other cerebral infarction: Secondary | ICD-10-CM | POA: Diagnosis not present

## 2017-11-03 DIAGNOSIS — R29705 NIHSS score 5: Secondary | ICD-10-CM | POA: Diagnosis present

## 2017-11-03 DIAGNOSIS — I63511 Cerebral infarction due to unspecified occlusion or stenosis of right middle cerebral artery: Secondary | ICD-10-CM | POA: Diagnosis not present

## 2017-11-03 DIAGNOSIS — Z993 Dependence on wheelchair: Secondary | ICD-10-CM | POA: Diagnosis not present

## 2017-11-03 DIAGNOSIS — Z87440 Personal history of urinary (tract) infections: Secondary | ICD-10-CM | POA: Diagnosis not present

## 2017-11-03 DIAGNOSIS — E785 Hyperlipidemia, unspecified: Secondary | ICD-10-CM | POA: Diagnosis present

## 2017-11-03 DIAGNOSIS — Z4789 Encounter for other orthopedic aftercare: Secondary | ICD-10-CM | POA: Diagnosis not present

## 2017-11-03 DIAGNOSIS — F17210 Nicotine dependence, cigarettes, uncomplicated: Secondary | ICD-10-CM | POA: Diagnosis present

## 2017-11-03 DIAGNOSIS — R279 Unspecified lack of coordination: Secondary | ICD-10-CM | POA: Diagnosis not present

## 2017-11-03 DIAGNOSIS — F17200 Nicotine dependence, unspecified, uncomplicated: Secondary | ICD-10-CM | POA: Diagnosis not present

## 2017-11-03 DIAGNOSIS — R627 Adult failure to thrive: Secondary | ICD-10-CM | POA: Diagnosis present

## 2017-11-03 DIAGNOSIS — G458 Other transient cerebral ischemic attacks and related syndromes: Secondary | ICD-10-CM | POA: Diagnosis not present

## 2017-11-03 DIAGNOSIS — G35 Multiple sclerosis: Secondary | ICD-10-CM | POA: Diagnosis present

## 2017-11-03 DIAGNOSIS — N39498 Other specified urinary incontinence: Secondary | ICD-10-CM | POA: Diagnosis present

## 2017-11-11 DIAGNOSIS — F329 Major depressive disorder, single episode, unspecified: Secondary | ICD-10-CM | POA: Diagnosis not present

## 2017-11-11 DIAGNOSIS — S3210XD Unspecified fracture of sacrum, subsequent encounter for fracture with routine healing: Secondary | ICD-10-CM | POA: Diagnosis not present

## 2017-11-11 DIAGNOSIS — I6789 Other cerebrovascular disease: Secondary | ICD-10-CM | POA: Diagnosis not present

## 2017-11-11 DIAGNOSIS — Z7982 Long term (current) use of aspirin: Secondary | ICD-10-CM | POA: Diagnosis not present

## 2017-11-11 DIAGNOSIS — Z743 Need for continuous supervision: Secondary | ICD-10-CM | POA: Diagnosis not present

## 2017-11-11 DIAGNOSIS — I6389 Other cerebral infarction: Secondary | ICD-10-CM | POA: Diagnosis not present

## 2017-11-11 DIAGNOSIS — R296 Repeated falls: Secondary | ICD-10-CM | POA: Diagnosis not present

## 2017-11-11 DIAGNOSIS — S32591D Other specified fracture of right pubis, subsequent encounter for fracture with routine healing: Secondary | ICD-10-CM | POA: Diagnosis not present

## 2017-11-11 DIAGNOSIS — R2689 Other abnormalities of gait and mobility: Secondary | ICD-10-CM | POA: Diagnosis not present

## 2017-11-11 DIAGNOSIS — Z4802 Encounter for removal of sutures: Secondary | ICD-10-CM | POA: Diagnosis not present

## 2017-11-11 DIAGNOSIS — I63511 Cerebral infarction due to unspecified occlusion or stenosis of right middle cerebral artery: Secondary | ICD-10-CM | POA: Diagnosis not present

## 2017-11-11 DIAGNOSIS — Z79899 Other long term (current) drug therapy: Secondary | ICD-10-CM | POA: Diagnosis not present

## 2017-11-11 DIAGNOSIS — G35 Multiple sclerosis: Secondary | ICD-10-CM | POA: Diagnosis not present

## 2017-11-11 DIAGNOSIS — M6281 Muscle weakness (generalized): Secondary | ICD-10-CM | POA: Diagnosis not present

## 2017-11-11 DIAGNOSIS — M25551 Pain in right hip: Secondary | ICD-10-CM | POA: Diagnosis not present

## 2017-11-11 DIAGNOSIS — R569 Unspecified convulsions: Secondary | ICD-10-CM | POA: Diagnosis not present

## 2017-11-11 DIAGNOSIS — G4089 Other seizures: Secondary | ICD-10-CM | POA: Diagnosis not present

## 2017-11-11 DIAGNOSIS — Z8673 Personal history of transient ischemic attack (TIA), and cerebral infarction without residual deficits: Secondary | ICD-10-CM | POA: Diagnosis not present

## 2017-11-11 DIAGNOSIS — G40909 Epilepsy, unspecified, not intractable, without status epilepticus: Secondary | ICD-10-CM | POA: Diagnosis not present

## 2017-11-11 DIAGNOSIS — M545 Low back pain: Secondary | ICD-10-CM | POA: Diagnosis not present

## 2017-11-11 DIAGNOSIS — R636 Underweight: Secondary | ICD-10-CM | POA: Diagnosis not present

## 2017-11-11 DIAGNOSIS — Z681 Body mass index (BMI) 19 or less, adult: Secondary | ICD-10-CM | POA: Diagnosis not present

## 2017-11-11 DIAGNOSIS — G458 Other transient cerebral ischemic attacks and related syndromes: Secondary | ICD-10-CM | POA: Diagnosis not present

## 2017-11-11 DIAGNOSIS — Z4789 Encounter for other orthopedic aftercare: Secondary | ICD-10-CM | POA: Diagnosis not present

## 2017-11-11 DIAGNOSIS — E785 Hyperlipidemia, unspecified: Secondary | ICD-10-CM | POA: Diagnosis not present

## 2017-11-11 DIAGNOSIS — R279 Unspecified lack of coordination: Secondary | ICD-10-CM | POA: Diagnosis not present

## 2017-11-11 DIAGNOSIS — M899 Disorder of bone, unspecified: Secondary | ICD-10-CM | POA: Diagnosis not present

## 2017-11-11 DIAGNOSIS — I10 Essential (primary) hypertension: Secondary | ICD-10-CM | POA: Diagnosis not present

## 2017-11-11 DIAGNOSIS — G629 Polyneuropathy, unspecified: Secondary | ICD-10-CM | POA: Diagnosis not present

## 2017-11-25 ENCOUNTER — Telehealth: Payer: Self-pay | Admitting: Family Medicine

## 2017-11-25 NOTE — Telephone Encounter (Signed)
Ms. Jim Like would like for Dr. Louanne Skye or Mariam Dollar to call her regarding her sister, Kacelyn Fiest. She states Jalessa has had another stroke and she needs to speak with you.

## 2017-11-25 NOTE — Telephone Encounter (Signed)
Ms. Nogueras sister called and wanted to make you aware that they found out Christy Joseph had another stroke before last seeing you and was in the hospital at [facility] for a while.  She is now at [facility] doing rehab.

## 2017-11-25 NOTE — Telephone Encounter (Signed)
Okay thanks for the information 

## 2017-12-01 DIAGNOSIS — S3210XD Unspecified fracture of sacrum, subsequent encounter for fracture with routine healing: Secondary | ICD-10-CM | POA: Diagnosis not present

## 2017-12-01 DIAGNOSIS — Z4802 Encounter for removal of sutures: Secondary | ICD-10-CM | POA: Diagnosis not present

## 2017-12-18 DIAGNOSIS — G4089 Other seizures: Secondary | ICD-10-CM | POA: Diagnosis not present

## 2017-12-18 DIAGNOSIS — I6789 Other cerebrovascular disease: Secondary | ICD-10-CM | POA: Diagnosis not present

## 2017-12-18 DIAGNOSIS — M545 Low back pain: Secondary | ICD-10-CM | POA: Diagnosis not present

## 2017-12-18 DIAGNOSIS — G458 Other transient cerebral ischemic attacks and related syndromes: Secondary | ICD-10-CM | POA: Diagnosis not present

## 2017-12-21 DIAGNOSIS — S32591D Other specified fracture of right pubis, subsequent encounter for fracture with routine healing: Secondary | ICD-10-CM | POA: Diagnosis not present

## 2017-12-21 DIAGNOSIS — Z4789 Encounter for other orthopedic aftercare: Secondary | ICD-10-CM | POA: Diagnosis not present

## 2017-12-21 DIAGNOSIS — M899 Disorder of bone, unspecified: Secondary | ICD-10-CM | POA: Diagnosis not present

## 2017-12-31 DIAGNOSIS — G35 Multiple sclerosis: Secondary | ICD-10-CM | POA: Diagnosis not present

## 2017-12-31 DIAGNOSIS — Z8673 Personal history of transient ischemic attack (TIA), and cerebral infarction without residual deficits: Secondary | ICD-10-CM | POA: Diagnosis not present

## 2017-12-31 DIAGNOSIS — Z79899 Other long term (current) drug therapy: Secondary | ICD-10-CM | POA: Diagnosis not present

## 2017-12-31 DIAGNOSIS — Z7982 Long term (current) use of aspirin: Secondary | ICD-10-CM | POA: Diagnosis not present

## 2017-12-31 DIAGNOSIS — G40909 Epilepsy, unspecified, not intractable, without status epilepticus: Secondary | ICD-10-CM | POA: Diagnosis not present

## 2018-01-03 ENCOUNTER — Telehealth: Payer: Self-pay | Admitting: Family Medicine

## 2018-01-03 DIAGNOSIS — L02212 Cutaneous abscess of back [any part, except buttock]: Secondary | ICD-10-CM | POA: Diagnosis not present

## 2018-01-03 DIAGNOSIS — H2013 Chronic iridocyclitis, bilateral: Secondary | ICD-10-CM | POA: Diagnosis not present

## 2018-01-03 DIAGNOSIS — I69398 Other sequelae of cerebral infarction: Secondary | ICD-10-CM | POA: Diagnosis not present

## 2018-01-03 DIAGNOSIS — G35 Multiple sclerosis: Secondary | ICD-10-CM | POA: Diagnosis not present

## 2018-01-03 DIAGNOSIS — I10 Essential (primary) hypertension: Secondary | ICD-10-CM

## 2018-01-03 DIAGNOSIS — B0223 Postherpetic polyneuropathy: Secondary | ICD-10-CM | POA: Diagnosis not present

## 2018-01-03 DIAGNOSIS — M545 Low back pain: Secondary | ICD-10-CM | POA: Diagnosis not present

## 2018-01-03 DIAGNOSIS — S3210XD Unspecified fracture of sacrum, subsequent encounter for fracture with routine healing: Secondary | ICD-10-CM | POA: Diagnosis not present

## 2018-01-03 DIAGNOSIS — R569 Unspecified convulsions: Secondary | ICD-10-CM | POA: Diagnosis not present

## 2018-01-03 DIAGNOSIS — M6281 Muscle weakness (generalized): Secondary | ICD-10-CM | POA: Diagnosis not present

## 2018-01-03 DIAGNOSIS — Z4789 Encounter for other orthopedic aftercare: Secondary | ICD-10-CM | POA: Diagnosis not present

## 2018-01-03 DIAGNOSIS — Z87891 Personal history of nicotine dependence: Secondary | ICD-10-CM | POA: Diagnosis not present

## 2018-01-03 MED ORDER — AMLODIPINE BESYLATE 10 MG PO TABS: 10.0000 mg | ORAL_TABLET | Freq: Every day | ORAL | 1 refills | Status: DC

## 2018-01-03 MED ORDER — VENLAFAXINE HCL ER 75 MG PO CP24: 75.0000 mg | ORAL_CAPSULE | Freq: Every day | ORAL | 1 refills | Status: DC

## 2018-01-03 MED ORDER — ROSUVASTATIN CALCIUM 5 MG PO TABS: 5.0000 mg | ORAL_TABLET | Freq: Every day | ORAL | 1 refills | Status: AC

## 2018-01-03 NOTE — Telephone Encounter (Signed)
Go ahead and send prescription for the Effexor and the Crestor and the amlodipine.  I am good with her staying on the amlodipine as long as she is not feeling lightheaded and dizzy and faint.  If her blood pressures are running good then keep going on that medication.  Send 6 months worth of each.

## 2018-01-03 NOTE — Telephone Encounter (Signed)
Pt caregiver aware.

## 2018-01-03 NOTE — Telephone Encounter (Signed)
LM for a RC from caregiver.  I went ahead and sent in the 3 meds  (changes) to the [facility].

## 2018-01-03 NOTE — Telephone Encounter (Signed)
New meds added / changed at nursing home: Caregiver is concerned.  effexor 75 QD -AM added to the 150 mg zoloft she is already taking. They do not have this at home. If you think she needs all of this for depression - will need a RX for effexor  crestor 5 was started and pravastatin was d/c == she doesn't have that crestor - so she is still givng her pravastatin.   Amlodipine 10mg  They increased from the 2.5 daily that she was on. Bp seems to be ok, she is concerned with the drastic change. Will need new rx for this as well - if you keep her on the 10 mg.   appt 01/14/18 appt with [place]. [facility] for med refills today if needed.

## 2018-01-04 DIAGNOSIS — S3210XD Unspecified fracture of sacrum, subsequent encounter for fracture with routine healing: Secondary | ICD-10-CM | POA: Diagnosis not present

## 2018-01-04 DIAGNOSIS — M6281 Muscle weakness (generalized): Secondary | ICD-10-CM | POA: Diagnosis not present

## 2018-01-04 DIAGNOSIS — M545 Low back pain: Secondary | ICD-10-CM | POA: Diagnosis not present

## 2018-01-04 DIAGNOSIS — I10 Essential (primary) hypertension: Secondary | ICD-10-CM | POA: Diagnosis not present

## 2018-01-04 DIAGNOSIS — I69398 Other sequelae of cerebral infarction: Secondary | ICD-10-CM | POA: Diagnosis not present

## 2018-01-04 DIAGNOSIS — G35 Multiple sclerosis: Secondary | ICD-10-CM | POA: Diagnosis not present

## 2018-01-06 DIAGNOSIS — M6281 Muscle weakness (generalized): Secondary | ICD-10-CM | POA: Diagnosis not present

## 2018-01-06 DIAGNOSIS — G35 Multiple sclerosis: Secondary | ICD-10-CM | POA: Diagnosis not present

## 2018-01-06 DIAGNOSIS — M545 Low back pain: Secondary | ICD-10-CM | POA: Diagnosis not present

## 2018-01-06 DIAGNOSIS — S3210XD Unspecified fracture of sacrum, subsequent encounter for fracture with routine healing: Secondary | ICD-10-CM | POA: Diagnosis not present

## 2018-01-06 DIAGNOSIS — I69398 Other sequelae of cerebral infarction: Secondary | ICD-10-CM | POA: Diagnosis not present

## 2018-01-06 DIAGNOSIS — I10 Essential (primary) hypertension: Secondary | ICD-10-CM | POA: Diagnosis not present

## 2018-01-11 DIAGNOSIS — G35 Multiple sclerosis: Secondary | ICD-10-CM | POA: Diagnosis not present

## 2018-01-11 DIAGNOSIS — M6281 Muscle weakness (generalized): Secondary | ICD-10-CM | POA: Diagnosis not present

## 2018-01-11 DIAGNOSIS — I69398 Other sequelae of cerebral infarction: Secondary | ICD-10-CM | POA: Diagnosis not present

## 2018-01-11 DIAGNOSIS — S3210XD Unspecified fracture of sacrum, subsequent encounter for fracture with routine healing: Secondary | ICD-10-CM | POA: Diagnosis not present

## 2018-01-11 DIAGNOSIS — I10 Essential (primary) hypertension: Secondary | ICD-10-CM | POA: Diagnosis not present

## 2018-01-11 DIAGNOSIS — M545 Low back pain: Secondary | ICD-10-CM | POA: Diagnosis not present

## 2018-01-13 DIAGNOSIS — M545 Low back pain: Secondary | ICD-10-CM | POA: Diagnosis not present

## 2018-01-13 DIAGNOSIS — S3210XD Unspecified fracture of sacrum, subsequent encounter for fracture with routine healing: Secondary | ICD-10-CM | POA: Diagnosis not present

## 2018-01-13 DIAGNOSIS — G35 Multiple sclerosis: Secondary | ICD-10-CM | POA: Diagnosis not present

## 2018-01-13 DIAGNOSIS — M6281 Muscle weakness (generalized): Secondary | ICD-10-CM | POA: Diagnosis not present

## 2018-01-13 DIAGNOSIS — I10 Essential (primary) hypertension: Secondary | ICD-10-CM | POA: Diagnosis not present

## 2018-01-13 DIAGNOSIS — I69398 Other sequelae of cerebral infarction: Secondary | ICD-10-CM | POA: Diagnosis not present

## 2018-01-14 ENCOUNTER — Ambulatory Visit (INDEPENDENT_AMBULATORY_CARE_PROVIDER_SITE_OTHER): Payer: Medicare Other | Admitting: Family Medicine

## 2018-01-14 ENCOUNTER — Encounter: Payer: Self-pay | Admitting: Family Medicine

## 2018-01-14 VITALS — BP 114/71 | HR 80 | Temp 97.3°F | Ht 64.0 in | Wt 104.8 lb

## 2018-01-14 DIAGNOSIS — Z8673 Personal history of transient ischemic attack (TIA), and cerebral infarction without residual deficits: Secondary | ICD-10-CM

## 2018-01-14 DIAGNOSIS — R531 Weakness: Secondary | ICD-10-CM | POA: Diagnosis not present

## 2018-01-14 DIAGNOSIS — G35 Multiple sclerosis: Secondary | ICD-10-CM

## 2018-01-14 DIAGNOSIS — R296 Repeated falls: Secondary | ICD-10-CM

## 2018-01-14 NOTE — Progress Notes (Signed)
BP 114/71   Pulse 80   Temp (!) 97.3 F (36.3 C) (Oral)   Ht 5' 4" (1.626 m)   Wt 104 lb 12.8 oz (47.5 kg)   BMI 17.99 kg/m    Subjective:    Patient ID: Christy Joseph, female    DOB: 18-Jan-1961, 57 y.o.   MRN: [id number]  HPI: Christy Joseph is a 57 y.o. female presenting on 01/14/2018 for Hospitalization Follow-up [facility] 6/14? Stroke and falls. Patient then went to the [facility] and was there until 8/4); Fall (Patient has had two falls in the last 2 days); and Extremity Weakness (Patient is sleeping more than she normaly does.)   HPI Hospital follow-up for strokes and falls and weakness Patient is coming in for hospital follow-up.  She was admitted to [facility] on 11/12/2017 for stroke and having recurrent falls.  Since leaving the hospital she is continued to have recurrent falls when she is been home.  Initially she went to a nursing center at the [facility] and was there until 01/02/2018 when she was finally discharged.  Since being discharged she has had 2 falls over the past week.  Patient continues to have weakness, she says more on the right than the left, her previous strokes affected her more on the left than the right.  Patient also has multiple sclerosis which significantly affects her strength and energy.  Since leaving the hospital she is also been sleeping more than what she was before and when fatigue and her energy just has not been as much as it was before.  Her who is here with her today is concerned that the falls are happening more often and she has not been using her cane and wants [place] to call her home health care and see if we can get more physical therapy through them to help her more.  Relevant past medical, surgical, family and social history reviewed and updated as indicated. Interim medical history since our last visit reviewed. Allergies and medications reviewed and updated.  Review of Systems  Constitutional: Positive for  fatigue. Negative for chills and fever.  Eyes: Negative for visual disturbance.  Respiratory: Negative for chest tightness and shortness of breath.   Cardiovascular: Negative for chest pain and leg swelling.  Musculoskeletal: Negative for back pain and gait problem.  Skin: Negative for rash.  Neurological: Positive for weakness. Negative for dizziness, light-headedness, numbness and headaches.  Psychiatric/Behavioral: Positive for sleep disturbance. Negative for agitation, behavioral problems, self-injury and suicidal ideas.  All other systems reviewed and are negative.   Per HPI unless specifically indicated above   Allergies as of 01/14/2018   No Known Allergies     Medication List        Accurate as of 01/14/18  2:05 PM. Always use your most recent med list.          acetaminophen 500 MG tablet Commonly known as:  TYLENOL Take 500-1,000 mg by mouth daily as needed for mild pain or moderate pain.   amLODipine 10 MG tablet Commonly known as:  NORVASC Take 1 tablet (10 mg total) by mouth daily.   aspirin 325 MG tablet Take 1 tablet (325 mg total) by mouth daily.   Calcium + D3 600-200 MG-UNIT Tabs Take 1 tablet by mouth daily.   Dimethyl Fumarate 120 & 240 MG Misc Take 124m by mouth twice daily for 7 days, then increase to 2436mtwice daily.   docusate sodium 100  MG capsule Commonly known as:  COLACE Take 1 capsule (100 mg total) by mouth every 12 (twelve) hours.   ibuprofen 400 MG tablet Commonly known as:  ADVIL,MOTRIN Take 400 mg by mouth every 6 (six) hours as needed.   levETIRAcetam 500 MG tablet Commonly known as:  KEPPRA Take 1 tablet (500 mg total) by mouth 2 (two) times daily.   oxyCODONE-acetaminophen 5-325 MG tablet Commonly known as:  PERCOCET/ROXICET Take 1-2 tablets by mouth every 4 (four) hours as needed for severe pain.   rosuvastatin 5 MG tablet Commonly known as:  CRESTOR Take 1 tablet (5 mg total) by mouth daily.   sertraline 50 MG  tablet Commonly known as:  ZOLOFT Take 1 tablet (50 mg total) by mouth every morning.   sertraline 100 MG tablet Commonly known as:  ZOLOFT Take 1 tablet (100 mg total) by mouth daily.   venlafaxine XR 75 MG 24 hr capsule Commonly known as:  EFFEXOR-XR Take 1 capsule (75 mg total) by mouth daily with breakfast.   Vitamin D3 5000 units Tabs Take 1 tablet (5,000 Units total) by mouth daily.          Objective:    BP 114/71   Pulse 80   Temp (!) 97.3 F (36.3 C) (Oral)   Ht 5' 4" (1.626 m)   Wt 104 lb 12.8 oz (47.5 kg)   BMI 17.99 kg/m   Wt Readings from Last 3 Encounters:  01/14/18 104 lb 12.8 oz (47.5 kg)  10/28/17 105 lb 4 oz (47.7 kg)  10/27/17 107 lb (48.5 kg)    Physical Exam  Constitutional: She is oriented to person, place, and time. She appears well-developed. She appears cachectic. No distress.  Eyes: Conjunctivae are normal.  Cardiovascular: Normal rate, regular rhythm, normal heart sounds and intact distal pulses.  No murmur heard. Pulmonary/Chest: Effort normal and breath sounds normal. No respiratory distress. She has no wheezes.  Neurological: She is alert and oriented to person, place, and time. She exhibits abnormal muscle tone (Right-sided weakness, 4 out of 5 strength, also slightly weak in left lower extremity as well.). Coordination normal.  Skin: Skin is warm and dry. No rash noted. She is not diaphoretic.  Psychiatric: She has a normal mood and affect. Her behavior is normal.  Nursing note and vitals reviewed.     Assessment & Plan:   Problem List Items Addressed This Visit      Nervous and Auditory   Multiple sclerosis ([place])   Relevant Orders   CMP14+EGFR   CBC with Differential/Platelet    Other Visit Diagnoses    Status post CVA    -  Primary   Relevant Orders   CMP14+EGFR   CBC with Differential/Platelet   Right sided weakness       Falls frequently       Has home health physical therapy and is working with him but her sister she  feels like she has been having increased falls       Follow up plan: Return in about 4 weeks (around 02/11/2018), or if symptoms worsen or fail to improve.  Counseling provided for all of the vaccine components Orders Placed This Encounter  Procedures  . CMP14+EGFR  . CBC with Differential/Platelet    Caryl Pina, MD [facility] 01/14/2018, 2:05 PM

## 2018-01-15 LAB — CBC WITH DIFFERENTIAL/PLATELET
Basophils Absolute: 0 10*3/uL (ref 0.0–0.2)
Basos: 0 %
EOS (ABSOLUTE): 0.2 10*3/uL (ref 0.0–0.4)
EOS: 2 %
HEMATOCRIT: 33.3 % — AB (ref 34.0–46.6)
HEMOGLOBIN: 10.9 g/dL — AB (ref 11.1–15.9)
IMMATURE GRANS (ABS): 0 10*3/uL (ref 0.0–0.1)
IMMATURE GRANULOCYTES: 1 %
LYMPHS: 29 %
Lymphocytes Absolute: 2.3 10*3/uL (ref 0.7–3.1)
MCH: 30.3 pg (ref 26.6–33.0)
MCHC: 32.7 g/dL (ref 31.5–35.7)
MCV: 93 fL (ref 79–97)
MONOCYTES: 6 %
Monocytes Absolute: 0.5 10*3/uL (ref 0.1–0.9)
Neutrophils Absolute: 4.8 10*3/uL (ref 1.4–7.0)
Neutrophils: 62 %
Platelets: 454 10*3/uL — ABNORMAL HIGH (ref 150–450)
RBC: 3.6 x10E6/uL — ABNORMAL LOW (ref 3.77–5.28)
RDW: 13.9 % (ref 12.3–15.4)
WBC: 7.8 10*3/uL (ref 3.4–10.8)

## 2018-01-15 LAB — CMP14+EGFR
ALBUMIN: 4.6 g/dL (ref 3.5–5.5)
ALK PHOS: 110 IU/L (ref 39–117)
ALT: 12 IU/L (ref 0–32)
AST: 11 IU/L (ref 0–40)
Albumin/Globulin Ratio: 1.8 (ref 1.2–2.2)
BUN / CREAT RATIO: 22 (ref 9–23)
BUN: 17 mg/dL (ref 6–24)
Bilirubin Total: <0.2 mg/dL (ref 0.0–1.2)
CO2: 24 mmol/L (ref 20–29)
CREATININE: 0.77 mg/dL (ref 0.57–1.00)
Calcium: 10.6 mg/dL — ABNORMAL HIGH (ref 8.7–10.2)
Chloride: 100 mmol/L (ref 96–106)
GFR calc Af Amer: 99 mL/min/{1.73_m2} (ref >59)
GFR calc non Af Amer: 86 mL/min/{1.73_m2} (ref >59)
GLUCOSE: 77 mg/dL (ref 65–99)
Globulin, Total: 2.6 g/dL (ref 1.5–4.5)
Potassium: 4.9 mmol/L (ref 3.5–5.2)
Sodium: 144 mmol/L (ref 134–144)
TOTAL PROTEIN: 7.2 g/dL (ref 6.0–8.5)

## 2018-01-17 DIAGNOSIS — M545 Low back pain: Secondary | ICD-10-CM | POA: Diagnosis not present

## 2018-01-17 DIAGNOSIS — G35 Multiple sclerosis: Secondary | ICD-10-CM | POA: Diagnosis not present

## 2018-01-17 DIAGNOSIS — M6281 Muscle weakness (generalized): Secondary | ICD-10-CM | POA: Diagnosis not present

## 2018-01-17 DIAGNOSIS — I10 Essential (primary) hypertension: Secondary | ICD-10-CM | POA: Diagnosis not present

## 2018-01-17 DIAGNOSIS — I69398 Other sequelae of cerebral infarction: Secondary | ICD-10-CM | POA: Diagnosis not present

## 2018-01-17 DIAGNOSIS — S3210XD Unspecified fracture of sacrum, subsequent encounter for fracture with routine healing: Secondary | ICD-10-CM | POA: Diagnosis not present

## 2018-01-19 DIAGNOSIS — S3210XD Unspecified fracture of sacrum, subsequent encounter for fracture with routine healing: Secondary | ICD-10-CM | POA: Diagnosis not present

## 2018-01-19 DIAGNOSIS — I10 Essential (primary) hypertension: Secondary | ICD-10-CM | POA: Diagnosis not present

## 2018-01-19 DIAGNOSIS — I69398 Other sequelae of cerebral infarction: Secondary | ICD-10-CM | POA: Diagnosis not present

## 2018-01-19 DIAGNOSIS — G35 Multiple sclerosis: Secondary | ICD-10-CM | POA: Diagnosis not present

## 2018-01-19 DIAGNOSIS — M545 Low back pain: Secondary | ICD-10-CM | POA: Diagnosis not present

## 2018-01-19 DIAGNOSIS — M6281 Muscle weakness (generalized): Secondary | ICD-10-CM | POA: Diagnosis not present

## 2018-01-20 DIAGNOSIS — S3210XD Unspecified fracture of sacrum, subsequent encounter for fracture with routine healing: Secondary | ICD-10-CM | POA: Diagnosis not present

## 2018-01-20 DIAGNOSIS — M545 Low back pain: Secondary | ICD-10-CM | POA: Diagnosis not present

## 2018-01-20 DIAGNOSIS — I69398 Other sequelae of cerebral infarction: Secondary | ICD-10-CM | POA: Diagnosis not present

## 2018-01-20 DIAGNOSIS — M6281 Muscle weakness (generalized): Secondary | ICD-10-CM | POA: Diagnosis not present

## 2018-01-20 DIAGNOSIS — G35 Multiple sclerosis: Secondary | ICD-10-CM | POA: Diagnosis not present

## 2018-01-20 DIAGNOSIS — I10 Essential (primary) hypertension: Secondary | ICD-10-CM | POA: Diagnosis not present

## 2018-01-24 DIAGNOSIS — G35 Multiple sclerosis: Secondary | ICD-10-CM | POA: Diagnosis not present

## 2018-01-24 DIAGNOSIS — M6281 Muscle weakness (generalized): Secondary | ICD-10-CM | POA: Diagnosis not present

## 2018-01-24 DIAGNOSIS — M545 Low back pain: Secondary | ICD-10-CM | POA: Diagnosis not present

## 2018-01-24 DIAGNOSIS — S3210XD Unspecified fracture of sacrum, subsequent encounter for fracture with routine healing: Secondary | ICD-10-CM | POA: Diagnosis not present

## 2018-01-24 DIAGNOSIS — I69398 Other sequelae of cerebral infarction: Secondary | ICD-10-CM | POA: Diagnosis not present

## 2018-01-24 DIAGNOSIS — I10 Essential (primary) hypertension: Secondary | ICD-10-CM | POA: Diagnosis not present

## 2018-01-26 ENCOUNTER — Ambulatory Visit (INDEPENDENT_AMBULATORY_CARE_PROVIDER_SITE_OTHER): Payer: Medicare Other

## 2018-01-26 DIAGNOSIS — F17210 Nicotine dependence, cigarettes, uncomplicated: Secondary | ICD-10-CM | POA: Diagnosis not present

## 2018-01-26 DIAGNOSIS — H2013 Chronic iridocyclitis, bilateral: Secondary | ICD-10-CM | POA: Diagnosis not present

## 2018-01-26 DIAGNOSIS — Z8673 Personal history of transient ischemic attack (TIA), and cerebral infarction without residual deficits: Secondary | ICD-10-CM | POA: Diagnosis not present

## 2018-01-26 DIAGNOSIS — R569 Unspecified convulsions: Secondary | ICD-10-CM | POA: Diagnosis not present

## 2018-01-26 DIAGNOSIS — M545 Low back pain: Secondary | ICD-10-CM | POA: Diagnosis not present

## 2018-01-26 DIAGNOSIS — G35 Multiple sclerosis: Secondary | ICD-10-CM | POA: Diagnosis not present

## 2018-01-26 DIAGNOSIS — I69398 Other sequelae of cerebral infarction: Secondary | ICD-10-CM | POA: Diagnosis not present

## 2018-01-26 DIAGNOSIS — M6281 Muscle weakness (generalized): Secondary | ICD-10-CM

## 2018-01-26 DIAGNOSIS — Z7982 Long term (current) use of aspirin: Secondary | ICD-10-CM | POA: Diagnosis not present

## 2018-01-26 DIAGNOSIS — G40909 Epilepsy, unspecified, not intractable, without status epilepticus: Secondary | ICD-10-CM | POA: Diagnosis not present

## 2018-01-26 DIAGNOSIS — B0223 Postherpetic polyneuropathy: Secondary | ICD-10-CM

## 2018-01-26 DIAGNOSIS — Z4789 Encounter for other orthopedic aftercare: Secondary | ICD-10-CM

## 2018-01-26 DIAGNOSIS — Z87891 Personal history of nicotine dependence: Secondary | ICD-10-CM

## 2018-01-26 DIAGNOSIS — L02212 Cutaneous abscess of back [any part, except buttock]: Secondary | ICD-10-CM

## 2018-01-26 DIAGNOSIS — I10 Essential (primary) hypertension: Secondary | ICD-10-CM

## 2018-01-26 DIAGNOSIS — S3210XD Unspecified fracture of sacrum, subsequent encounter for fracture with routine healing: Secondary | ICD-10-CM | POA: Diagnosis not present

## 2018-01-26 DIAGNOSIS — R296 Repeated falls: Secondary | ICD-10-CM | POA: Diagnosis not present

## 2018-01-26 DIAGNOSIS — I63511 Cerebral infarction due to unspecified occlusion or stenosis of right middle cerebral artery: Secondary | ICD-10-CM | POA: Diagnosis not present

## 2018-01-26 DIAGNOSIS — Z79899 Other long term (current) drug therapy: Secondary | ICD-10-CM | POA: Diagnosis not present

## 2018-01-26 DIAGNOSIS — F329 Major depressive disorder, single episode, unspecified: Secondary | ICD-10-CM | POA: Diagnosis not present

## 2018-01-27 DIAGNOSIS — S3210XD Unspecified fracture of sacrum, subsequent encounter for fracture with routine healing: Secondary | ICD-10-CM | POA: Diagnosis not present

## 2018-01-27 DIAGNOSIS — I69398 Other sequelae of cerebral infarction: Secondary | ICD-10-CM | POA: Diagnosis not present

## 2018-01-27 DIAGNOSIS — M6281 Muscle weakness (generalized): Secondary | ICD-10-CM | POA: Diagnosis not present

## 2018-01-27 DIAGNOSIS — M545 Low back pain: Secondary | ICD-10-CM | POA: Diagnosis not present

## 2018-01-27 DIAGNOSIS — G35 Multiple sclerosis: Secondary | ICD-10-CM | POA: Diagnosis not present

## 2018-01-27 DIAGNOSIS — I10 Essential (primary) hypertension: Secondary | ICD-10-CM | POA: Diagnosis not present

## 2018-01-28 DIAGNOSIS — M545 Low back pain: Secondary | ICD-10-CM | POA: Diagnosis not present

## 2018-01-28 DIAGNOSIS — M6281 Muscle weakness (generalized): Secondary | ICD-10-CM | POA: Diagnosis not present

## 2018-01-28 DIAGNOSIS — I10 Essential (primary) hypertension: Secondary | ICD-10-CM | POA: Diagnosis not present

## 2018-01-28 DIAGNOSIS — I69398 Other sequelae of cerebral infarction: Secondary | ICD-10-CM | POA: Diagnosis not present

## 2018-01-28 DIAGNOSIS — G35 Multiple sclerosis: Secondary | ICD-10-CM | POA: Diagnosis not present

## 2018-01-28 DIAGNOSIS — S3210XD Unspecified fracture of sacrum, subsequent encounter for fracture with routine healing: Secondary | ICD-10-CM | POA: Diagnosis not present

## 2018-02-01 DIAGNOSIS — I69398 Other sequelae of cerebral infarction: Secondary | ICD-10-CM | POA: Diagnosis not present

## 2018-02-01 DIAGNOSIS — S3210XD Unspecified fracture of sacrum, subsequent encounter for fracture with routine healing: Secondary | ICD-10-CM | POA: Diagnosis not present

## 2018-02-01 DIAGNOSIS — G35 Multiple sclerosis: Secondary | ICD-10-CM | POA: Diagnosis not present

## 2018-02-01 DIAGNOSIS — M545 Low back pain: Secondary | ICD-10-CM | POA: Diagnosis not present

## 2018-02-01 DIAGNOSIS — I10 Essential (primary) hypertension: Secondary | ICD-10-CM | POA: Diagnosis not present

## 2018-02-01 DIAGNOSIS — M6281 Muscle weakness (generalized): Secondary | ICD-10-CM | POA: Diagnosis not present

## 2018-02-02 DIAGNOSIS — M6281 Muscle weakness (generalized): Secondary | ICD-10-CM | POA: Diagnosis not present

## 2018-02-02 DIAGNOSIS — M545 Low back pain: Secondary | ICD-10-CM | POA: Diagnosis not present

## 2018-02-02 DIAGNOSIS — I69398 Other sequelae of cerebral infarction: Secondary | ICD-10-CM | POA: Diagnosis not present

## 2018-02-02 DIAGNOSIS — I10 Essential (primary) hypertension: Secondary | ICD-10-CM | POA: Diagnosis not present

## 2018-02-02 DIAGNOSIS — G35 Multiple sclerosis: Secondary | ICD-10-CM | POA: Diagnosis not present

## 2018-02-02 DIAGNOSIS — S3210XD Unspecified fracture of sacrum, subsequent encounter for fracture with routine healing: Secondary | ICD-10-CM | POA: Diagnosis not present

## 2018-02-03 ENCOUNTER — Ambulatory Visit (INDEPENDENT_AMBULATORY_CARE_PROVIDER_SITE_OTHER): Payer: Medicare Other | Admitting: Family Medicine

## 2018-02-03 ENCOUNTER — Encounter: Payer: Self-pay | Admitting: Family Medicine

## 2018-02-03 VITALS — BP 123/81 | HR 75 | Temp 98.0°F | Ht 64.0 in | Wt 106.2 lb

## 2018-02-03 DIAGNOSIS — F32A Depression, unspecified: Secondary | ICD-10-CM

## 2018-02-03 DIAGNOSIS — F329 Major depressive disorder, single episode, unspecified: Secondary | ICD-10-CM | POA: Diagnosis not present

## 2018-02-03 DIAGNOSIS — F419 Anxiety disorder, unspecified: Secondary | ICD-10-CM | POA: Diagnosis not present

## 2018-02-03 MED ORDER — BUPROPION HCL ER (XL) 150 MG PO TB24: 150.0000 mg | ORAL_TABLET | Freq: Every day | ORAL | 2 refills | Status: DC

## 2018-02-03 NOTE — Progress Notes (Signed)
BP 123/81   Pulse 75   Temp 98 F (36.7 C) (Oral)   Ht 5\' 4"  (1.626 m)   Wt 106 lb 3.2 oz (48.2 kg)   BMI 18.23 kg/m    Subjective:    Patient ID: Christy Joseph, female    DOB: 1960-12-03, 57 y.o.   MRN: [id number]  HPI: Christy Joseph is a 57 y.o. female presenting on 02/03/2018 for Extremity Weakness (3 week follow up- Patient states it is a little better. Hospital follow up was 8/16.  States she has not been taking the effexor since coming home from the nursing home.)   HPI Depression and lower extremity weakness Patient has been having continued weakness and recurrent falls.  She has worked with physical therapy on multiple occasions but because of her MS and previous strokes this is something that she fights recurrently.  She was in the hospital on 01/14/2018 for similar issues.  She does admit that her depression is been worse since she left the hospital and also admits that she had not been taking Effexor since then and her sister who helps care take for said she thought she was doing well without it but then has noticed recently over the past week that she is not doing well anymore.  Her sister is very concerned about her especially since she went to go back home and live with her husband who also has a lot of health issues  Relevant past medical, surgical, family and social history reviewed and updated as indicated. Interim medical history since our last visit reviewed. Allergies and medications reviewed and updated.  Review of Systems  Constitutional: Negative for chills and fever.  Eyes: Negative for visual disturbance.  Respiratory: Negative for chest tightness and shortness of breath.   Cardiovascular: Negative for chest pain and leg swelling.  Musculoskeletal: Positive for myalgias. Negative for arthralgias, back pain and gait problem.  Skin: Negative for rash.  Neurological: Positive for weakness. Negative for light-headedness, numbness and headaches.    Psychiatric/Behavioral: Positive for decreased concentration, dysphoric mood and sleep disturbance. Negative for agitation, behavioral problems, self-injury and suicidal ideas. The patient is nervous/anxious.   All other systems reviewed and are negative.   Per HPI unless specifically indicated above   Allergies as of 02/03/2018   No Known Allergies     Medication List        Accurate as of 02/03/18 11:22 AM. Always use your most recent med list.          acetaminophen 500 MG tablet Commonly known as:  TYLENOL Take 500-1,000 mg by mouth daily as needed for mild pain or moderate pain.   amLODipine 10 MG tablet Commonly known as:  NORVASC Take 1 tablet (10 mg total) by mouth daily.   aspirin 325 MG tablet Take 1 tablet (325 mg total) by mouth daily.   buPROPion 150 MG 24 hr tablet Commonly known as:  WELLBUTRIN XL Take 1 tablet (150 mg total) by mouth daily.   Calcium + D3 600-200 MG-UNIT Tabs Take 1 tablet by mouth daily.   Dimethyl Fumarate 120 & 240 MG Misc Take 120mg  by mouth twice daily for 7 days, then increase to 240mg  twice daily.   docusate sodium 100 MG capsule Commonly known as:  COLACE Take 1 capsule (100 mg total) by mouth every 12 (twelve) hours.   ibuprofen 400 MG tablet Commonly known as:  ADVIL,MOTRIN Take 400 mg by mouth every 6 (six) hours as needed.  levETIRAcetam 500 MG tablet Commonly known as:  KEPPRA Take 1 tablet (500 mg total) by mouth 2 (two) times daily.   oxyCODONE-acetaminophen 5-325 MG tablet Commonly known as:  PERCOCET/ROXICET Take 1-2 tablets by mouth every 4 (four) hours as needed for severe pain.   rosuvastatin 5 MG tablet Commonly known as:  CRESTOR Take 1 tablet (5 mg total) by mouth daily.   sertraline 50 MG tablet Commonly known as:  ZOLOFT Take 1 tablet (50 mg total) by mouth every morning.   sertraline 100 MG tablet Commonly known as:  ZOLOFT Take 1 tablet (100 mg total) by mouth daily.   venlafaxine XR 75 MG  24 hr capsule Commonly known as:  EFFEXOR-XR Take 1 capsule (75 mg total) by mouth daily with breakfast.   Vitamin D3 5000 units Tabs Take 1 tablet (5,000 Units total) by mouth daily.          Objective:    BP 123/81   Pulse 75   Temp 98 F (36.7 C) (Oral)   Ht 5\' 4"  (1.626 m)   Wt 106 lb 3.2 oz (48.2 kg)   BMI 18.23 kg/m   Wt Readings from Last 3 Encounters:  02/03/18 106 lb 3.2 oz (48.2 kg)  01/14/18 104 lb 12.8 oz (47.5 kg)  10/28/17 105 lb 4 oz (47.7 kg)    Physical Exam  Constitutional: She is oriented to person, place, and time. She appears well-developed and well-nourished. No distress.  Eyes: Conjunctivae are normal.  Cardiovascular: Normal rate, regular rhythm, normal heart sounds and intact distal pulses.  No murmur heard. Pulmonary/Chest: Effort normal and breath sounds normal. No respiratory distress. She has no wheezes.  Neurological: She is alert and oriented to person, place, and time. Coordination normal.  Skin: Skin is warm and dry. No rash noted. She is not diaphoretic.  Psychiatric: Her behavior is normal. Judgment normal. Her mood appears anxious. She exhibits a depressed mood. She expresses no suicidal ideation. She expresses no suicidal plans.  Nursing note and vitals reviewed.      Assessment & Plan:   Problem List Items Addressed This Visit      Other   Anxiety and depression - Primary   Relevant Medications   buPROPion (WELLBUTRIN XL) 150 MG 24 hr tablet    I added Wellbutrin to her Zoloft to see if it can help a little bit more with her mood and focus and concentration. Return in 2 months  Follow up plan: Return in about 8 weeks (around 03/31/2018), or if symptoms worsen or fail to improve.  Counseling provided for all of the vaccine components No orders of the defined types were placed in this encounter.   Arville Care, MD [facility] 02/03/2018, 11:22 AM

## 2018-02-04 DIAGNOSIS — M6281 Muscle weakness (generalized): Secondary | ICD-10-CM | POA: Diagnosis not present

## 2018-02-04 DIAGNOSIS — M545 Low back pain: Secondary | ICD-10-CM | POA: Diagnosis not present

## 2018-02-04 DIAGNOSIS — S3210XD Unspecified fracture of sacrum, subsequent encounter for fracture with routine healing: Secondary | ICD-10-CM | POA: Diagnosis not present

## 2018-02-04 DIAGNOSIS — G35 Multiple sclerosis: Secondary | ICD-10-CM | POA: Diagnosis not present

## 2018-02-04 DIAGNOSIS — I10 Essential (primary) hypertension: Secondary | ICD-10-CM | POA: Diagnosis not present

## 2018-02-04 DIAGNOSIS — I69398 Other sequelae of cerebral infarction: Secondary | ICD-10-CM | POA: Diagnosis not present

## 2018-02-07 DIAGNOSIS — I69398 Other sequelae of cerebral infarction: Secondary | ICD-10-CM | POA: Diagnosis not present

## 2018-02-07 DIAGNOSIS — S3210XD Unspecified fracture of sacrum, subsequent encounter for fracture with routine healing: Secondary | ICD-10-CM | POA: Diagnosis not present

## 2018-02-07 DIAGNOSIS — G35 Multiple sclerosis: Secondary | ICD-10-CM | POA: Diagnosis not present

## 2018-02-07 DIAGNOSIS — I10 Essential (primary) hypertension: Secondary | ICD-10-CM | POA: Diagnosis not present

## 2018-02-07 DIAGNOSIS — M545 Low back pain: Secondary | ICD-10-CM | POA: Diagnosis not present

## 2018-02-07 DIAGNOSIS — M6281 Muscle weakness (generalized): Secondary | ICD-10-CM | POA: Diagnosis not present

## 2018-02-08 DIAGNOSIS — I10 Essential (primary) hypertension: Secondary | ICD-10-CM | POA: Diagnosis not present

## 2018-02-08 DIAGNOSIS — I69398 Other sequelae of cerebral infarction: Secondary | ICD-10-CM | POA: Diagnosis not present

## 2018-02-08 DIAGNOSIS — M545 Low back pain: Secondary | ICD-10-CM | POA: Diagnosis not present

## 2018-02-08 DIAGNOSIS — S3210XD Unspecified fracture of sacrum, subsequent encounter for fracture with routine healing: Secondary | ICD-10-CM | POA: Diagnosis not present

## 2018-02-08 DIAGNOSIS — M6281 Muscle weakness (generalized): Secondary | ICD-10-CM | POA: Diagnosis not present

## 2018-02-08 DIAGNOSIS — G35 Multiple sclerosis: Secondary | ICD-10-CM | POA: Diagnosis not present

## 2018-02-09 DIAGNOSIS — M545 Low back pain: Secondary | ICD-10-CM | POA: Diagnosis not present

## 2018-02-09 DIAGNOSIS — S3210XD Unspecified fracture of sacrum, subsequent encounter for fracture with routine healing: Secondary | ICD-10-CM | POA: Diagnosis not present

## 2018-02-09 DIAGNOSIS — I10 Essential (primary) hypertension: Secondary | ICD-10-CM | POA: Diagnosis not present

## 2018-02-09 DIAGNOSIS — M6281 Muscle weakness (generalized): Secondary | ICD-10-CM | POA: Diagnosis not present

## 2018-02-09 DIAGNOSIS — I69398 Other sequelae of cerebral infarction: Secondary | ICD-10-CM | POA: Diagnosis not present

## 2018-02-09 DIAGNOSIS — G35 Multiple sclerosis: Secondary | ICD-10-CM | POA: Diagnosis not present

## 2018-02-10 ENCOUNTER — Ambulatory Visit (INDEPENDENT_AMBULATORY_CARE_PROVIDER_SITE_OTHER): Payer: Medicare Other | Admitting: *Deleted

## 2018-02-10 ENCOUNTER — Encounter: Payer: Self-pay | Admitting: *Deleted

## 2018-02-10 VITALS — BP 119/79 | HR 76 | Ht 61.0 in | Wt 111.0 lb

## 2018-02-10 DIAGNOSIS — Z0000 Encounter for general adult medical examination without abnormal findings: Secondary | ICD-10-CM | POA: Diagnosis not present

## 2018-02-10 MED ORDER — SERTRALINE HCL 100 MG PO TABS
150.0000 mg | ORAL_TABLET | Freq: Every day | ORAL | 1 refills | Status: AC
Start: 2018-02-10 — End: ?

## 2018-02-10 NOTE — Patient Instructions (Signed)
  Christy Joseph , Thank you for taking time to come for your Medicare Wellness Visit. I appreciate your ongoing commitment to your health goals. Please review the following plan we discussed and let me know if I can assist you in the future.   These are the goals we discussed: Goals    . Exercise 3x per week (30 min per time)    . Have 3 meals a day       This is a list of the screening recommended for you and due dates:  Health Maintenance  Topic Date Due  .  Hepatitis C: One time screening is recommended by Center for Disease Control  (CDC) for  adults born from [birth year] through 1965.   [birth year]  . Flu Shot  12/30/2017  . Mammogram  11/18/2018  . Pap Smear  10/30/2019  . Tetanus Vaccine  03/11/2022  . Colon Cancer Screening  02/21/2024  . HIV Screening  Completed
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
## 2018-02-11 DIAGNOSIS — M545 Low back pain: Secondary | ICD-10-CM | POA: Diagnosis not present

## 2018-02-11 DIAGNOSIS — G35 Multiple sclerosis: Secondary | ICD-10-CM | POA: Diagnosis not present

## 2018-02-11 DIAGNOSIS — I10 Essential (primary) hypertension: Secondary | ICD-10-CM | POA: Diagnosis not present

## 2018-02-11 DIAGNOSIS — S3210XD Unspecified fracture of sacrum, subsequent encounter for fracture with routine healing: Secondary | ICD-10-CM | POA: Diagnosis not present

## 2018-02-11 DIAGNOSIS — M6281 Muscle weakness (generalized): Secondary | ICD-10-CM | POA: Diagnosis not present

## 2018-02-11 DIAGNOSIS — I69398 Other sequelae of cerebral infarction: Secondary | ICD-10-CM | POA: Diagnosis not present

## 2018-02-14 ENCOUNTER — Telehealth: Payer: Self-pay | Admitting: Family Medicine

## 2018-02-14 DIAGNOSIS — M545 Low back pain: Secondary | ICD-10-CM | POA: Diagnosis not present

## 2018-02-14 DIAGNOSIS — G35 Multiple sclerosis: Secondary | ICD-10-CM | POA: Diagnosis not present

## 2018-02-14 DIAGNOSIS — S3210XD Unspecified fracture of sacrum, subsequent encounter for fracture with routine healing: Secondary | ICD-10-CM | POA: Diagnosis not present

## 2018-02-14 DIAGNOSIS — I69398 Other sequelae of cerebral infarction: Secondary | ICD-10-CM | POA: Diagnosis not present

## 2018-02-14 DIAGNOSIS — M6281 Muscle weakness (generalized): Secondary | ICD-10-CM | POA: Diagnosis not present

## 2018-02-14 DIAGNOSIS — I10 Essential (primary) hypertension: Secondary | ICD-10-CM | POA: Diagnosis not present

## 2018-02-14 NOTE — Telephone Encounter (Signed)
Caregiver aware.

## 2018-02-14 NOTE — Telephone Encounter (Signed)
I think she is doing okay, I think she should keep her legs elevated and use compression stockings, if she develops any shortness of breath or swelling anywhere in her body besides her legs then she definitely needs to be seen, if it continues to worsen then also she needs to be seen.

## 2018-02-16 DIAGNOSIS — I69398 Other sequelae of cerebral infarction: Secondary | ICD-10-CM | POA: Diagnosis not present

## 2018-02-16 DIAGNOSIS — S3210XD Unspecified fracture of sacrum, subsequent encounter for fracture with routine healing: Secondary | ICD-10-CM | POA: Diagnosis not present

## 2018-02-16 DIAGNOSIS — M545 Low back pain: Secondary | ICD-10-CM | POA: Diagnosis not present

## 2018-02-16 DIAGNOSIS — I10 Essential (primary) hypertension: Secondary | ICD-10-CM | POA: Diagnosis not present

## 2018-02-16 DIAGNOSIS — M6281 Muscle weakness (generalized): Secondary | ICD-10-CM | POA: Diagnosis not present

## 2018-02-16 DIAGNOSIS — G35 Multiple sclerosis: Secondary | ICD-10-CM | POA: Diagnosis not present

## 2018-02-21 DIAGNOSIS — G35 Multiple sclerosis: Secondary | ICD-10-CM | POA: Diagnosis not present

## 2018-02-21 DIAGNOSIS — M6281 Muscle weakness (generalized): Secondary | ICD-10-CM | POA: Diagnosis not present

## 2018-02-21 DIAGNOSIS — I69398 Other sequelae of cerebral infarction: Secondary | ICD-10-CM | POA: Diagnosis not present

## 2018-02-21 DIAGNOSIS — M545 Low back pain: Secondary | ICD-10-CM | POA: Diagnosis not present

## 2018-02-21 DIAGNOSIS — I10 Essential (primary) hypertension: Secondary | ICD-10-CM | POA: Diagnosis not present

## 2018-02-21 DIAGNOSIS — S3210XD Unspecified fracture of sacrum, subsequent encounter for fracture with routine healing: Secondary | ICD-10-CM | POA: Diagnosis not present

## 2018-02-24 DIAGNOSIS — I69398 Other sequelae of cerebral infarction: Secondary | ICD-10-CM | POA: Diagnosis not present

## 2018-02-24 DIAGNOSIS — M545 Low back pain: Secondary | ICD-10-CM | POA: Diagnosis not present

## 2018-02-24 DIAGNOSIS — M6281 Muscle weakness (generalized): Secondary | ICD-10-CM | POA: Diagnosis not present

## 2018-02-24 DIAGNOSIS — I10 Essential (primary) hypertension: Secondary | ICD-10-CM | POA: Diagnosis not present

## 2018-02-24 DIAGNOSIS — S3210XD Unspecified fracture of sacrum, subsequent encounter for fracture with routine healing: Secondary | ICD-10-CM | POA: Diagnosis not present

## 2018-02-24 DIAGNOSIS — G35 Multiple sclerosis: Secondary | ICD-10-CM | POA: Diagnosis not present

## 2018-02-25 DIAGNOSIS — G35 Multiple sclerosis: Secondary | ICD-10-CM | POA: Diagnosis not present

## 2018-02-25 DIAGNOSIS — I69398 Other sequelae of cerebral infarction: Secondary | ICD-10-CM | POA: Diagnosis not present

## 2018-02-25 DIAGNOSIS — M545 Low back pain: Secondary | ICD-10-CM | POA: Diagnosis not present

## 2018-02-25 DIAGNOSIS — S3210XD Unspecified fracture of sacrum, subsequent encounter for fracture with routine healing: Secondary | ICD-10-CM | POA: Diagnosis not present

## 2018-02-25 DIAGNOSIS — M6281 Muscle weakness (generalized): Secondary | ICD-10-CM | POA: Diagnosis not present

## 2018-02-25 DIAGNOSIS — I10 Essential (primary) hypertension: Secondary | ICD-10-CM | POA: Diagnosis not present

## 2018-02-27 ENCOUNTER — Other Ambulatory Visit: Payer: Self-pay | Admitting: Family Medicine

## 2018-02-27 DIAGNOSIS — F419 Anxiety disorder, unspecified: Principal | ICD-10-CM

## 2018-02-27 DIAGNOSIS — F329 Major depressive disorder, single episode, unspecified: Secondary | ICD-10-CM

## 2018-02-28 DIAGNOSIS — M545 Low back pain: Secondary | ICD-10-CM | POA: Diagnosis not present

## 2018-02-28 DIAGNOSIS — I10 Essential (primary) hypertension: Secondary | ICD-10-CM | POA: Diagnosis not present

## 2018-02-28 DIAGNOSIS — S3210XD Unspecified fracture of sacrum, subsequent encounter for fracture with routine healing: Secondary | ICD-10-CM | POA: Diagnosis not present

## 2018-02-28 DIAGNOSIS — M6281 Muscle weakness (generalized): Secondary | ICD-10-CM | POA: Diagnosis not present

## 2018-02-28 DIAGNOSIS — I69398 Other sequelae of cerebral infarction: Secondary | ICD-10-CM | POA: Diagnosis not present

## 2018-02-28 DIAGNOSIS — G35 Multiple sclerosis: Secondary | ICD-10-CM | POA: Diagnosis not present

## 2018-03-01 DIAGNOSIS — M160 Bilateral primary osteoarthritis of hip: Secondary | ICD-10-CM | POA: Diagnosis not present

## 2018-03-01 DIAGNOSIS — S32122D Severely displaced Zone II fracture of sacrum, subsequent encounter for fracture with routine healing: Secondary | ICD-10-CM | POA: Diagnosis not present

## 2018-03-01 DIAGNOSIS — M898X5 Other specified disorders of bone, thigh: Secondary | ICD-10-CM | POA: Diagnosis not present

## 2018-03-01 DIAGNOSIS — S3210XD Unspecified fracture of sacrum, subsequent encounter for fracture with routine healing: Secondary | ICD-10-CM | POA: Diagnosis not present

## 2018-03-01 DIAGNOSIS — S32591D Other specified fracture of right pubis, subsequent encounter for fracture with routine healing: Secondary | ICD-10-CM | POA: Diagnosis not present

## 2018-03-02 DIAGNOSIS — G35 Multiple sclerosis: Secondary | ICD-10-CM | POA: Diagnosis not present

## 2018-03-02 DIAGNOSIS — S3210XD Unspecified fracture of sacrum, subsequent encounter for fracture with routine healing: Secondary | ICD-10-CM | POA: Diagnosis not present

## 2018-03-02 DIAGNOSIS — M545 Low back pain: Secondary | ICD-10-CM | POA: Diagnosis not present

## 2018-03-02 DIAGNOSIS — I69398 Other sequelae of cerebral infarction: Secondary | ICD-10-CM | POA: Diagnosis not present

## 2018-03-02 DIAGNOSIS — I10 Essential (primary) hypertension: Secondary | ICD-10-CM | POA: Diagnosis not present

## 2018-03-02 DIAGNOSIS — M6281 Muscle weakness (generalized): Secondary | ICD-10-CM | POA: Diagnosis not present

## 2018-03-03 DIAGNOSIS — M545 Low back pain: Secondary | ICD-10-CM | POA: Diagnosis not present

## 2018-03-03 DIAGNOSIS — S3210XD Unspecified fracture of sacrum, subsequent encounter for fracture with routine healing: Secondary | ICD-10-CM | POA: Diagnosis not present

## 2018-03-03 DIAGNOSIS — G35 Multiple sclerosis: Secondary | ICD-10-CM | POA: Diagnosis not present

## 2018-03-03 DIAGNOSIS — I10 Essential (primary) hypertension: Secondary | ICD-10-CM | POA: Diagnosis not present

## 2018-03-03 DIAGNOSIS — M6281 Muscle weakness (generalized): Secondary | ICD-10-CM | POA: Diagnosis not present

## 2018-03-03 DIAGNOSIS — I69398 Other sequelae of cerebral infarction: Secondary | ICD-10-CM | POA: Diagnosis not present

## 2018-03-15 ENCOUNTER — Ambulatory Visit (INDEPENDENT_AMBULATORY_CARE_PROVIDER_SITE_OTHER): Payer: Medicare Other

## 2018-03-15 DIAGNOSIS — Z23 Encounter for immunization: Secondary | ICD-10-CM | POA: Diagnosis not present

## 2018-06-03 ENCOUNTER — Other Ambulatory Visit: Payer: Self-pay | Admitting: Family Medicine

## 2018-06-03 DIAGNOSIS — R569 Unspecified convulsions: Secondary | ICD-10-CM

## 2018-06-17 ENCOUNTER — Other Ambulatory Visit: Payer: Self-pay | Admitting: Family Medicine

## 2018-06-17 DIAGNOSIS — R569 Unspecified convulsions: Secondary | ICD-10-CM

## 2018-06-21 ENCOUNTER — Other Ambulatory Visit: Payer: Self-pay | Admitting: Family Medicine

## 2018-06-21 DIAGNOSIS — R569 Unspecified convulsions: Secondary | ICD-10-CM

## 2018-06-22 NOTE — Telephone Encounter (Signed)
Last seen 02/03/18

## 2018-08-05 ENCOUNTER — Other Ambulatory Visit: Payer: Self-pay | Admitting: Gerontology

## 2018-08-05 DIAGNOSIS — Z1231 Encounter for screening mammogram for malignant neoplasm of breast: Secondary | ICD-10-CM

## 2018-08-08 ENCOUNTER — Ambulatory Visit (INDEPENDENT_AMBULATORY_CARE_PROVIDER_SITE_OTHER): Payer: Medicare (Managed Care) | Admitting: Neurology

## 2018-08-08 ENCOUNTER — Encounter: Payer: Self-pay | Admitting: Neurology

## 2018-08-08 ENCOUNTER — Other Ambulatory Visit: Payer: Self-pay

## 2018-08-08 VITALS — BP 141/81 | HR 69 | Ht 64.0 in | Wt 102.5 lb

## 2018-08-08 DIAGNOSIS — R569 Unspecified convulsions: Secondary | ICD-10-CM | POA: Diagnosis not present

## 2018-08-08 DIAGNOSIS — G35 Multiple sclerosis: Secondary | ICD-10-CM | POA: Diagnosis not present

## 2018-08-08 DIAGNOSIS — R269 Unspecified abnormalities of gait and mobility: Secondary | ICD-10-CM | POA: Diagnosis not present

## 2018-08-08 DIAGNOSIS — F09 Unspecified mental disorder due to known physiological condition: Secondary | ICD-10-CM | POA: Diagnosis not present

## 2018-08-08 MED ORDER — LEFLUNOMIDE 20 MG PO TABS: ORAL_TABLET | ORAL | 11 refills | Status: DC

## 2018-08-08 MED ORDER — GABAPENTIN 100 MG PO CAPS: ORAL_CAPSULE | ORAL | 5 refills | Status: DC

## 2018-08-08 MED ORDER — LEVETIRACETAM ER 500 MG PO TB24: 1000.0000 mg | ORAL_TABLET | Freq: Every day | ORAL | 11 refills | Status: DC
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
## 2018-08-08 NOTE — Patient Instructions (Signed)
Leflunomide 10 mg for 30 days then increase to 20 mg daily  Gabapentin 100 mg in the morning, 100 mg in the afternoon and 200 mg at night  Change levetiracetam to levetiracetam XR (time release) two 500 mg pills daily

## 2018-08-09 ENCOUNTER — Ambulatory Visit
Admission: RE | Admit: 2018-08-09 | Discharge: 2018-08-09 | Disposition: A | Discharge status: Home / Self Care | Payer: Medicaid Other | Source: Ambulatory Visit | Attending: Gerontology | Admitting: Gerontology

## 2018-08-09 DIAGNOSIS — Z1231 Encounter for screening mammogram for malignant neoplasm of breast: Secondary | ICD-10-CM

## 2018-08-11 ENCOUNTER — Other Ambulatory Visit: Payer: Self-pay | Admitting: Gerontology

## 2018-08-11 DIAGNOSIS — R928 Other abnormal and inconclusive findings on diagnostic imaging of breast: Secondary | ICD-10-CM

## 2018-08-17 ENCOUNTER — Ambulatory Visit
Admission: RE | Admit: 2018-08-17 | Discharge: 2018-08-17 | Disposition: A | Discharge status: Home / Self Care | Payer: Medicaid Other | Source: Ambulatory Visit | Attending: Gerontology | Admitting: Gerontology

## 2018-08-17 ENCOUNTER — Other Ambulatory Visit: Payer: Self-pay

## 2018-08-17 ENCOUNTER — Ambulatory Visit: Payer: Medicaid Other

## 2018-08-17 DIAGNOSIS — R928 Other abnormal and inconclusive findings on diagnostic imaging of breast: Secondary | ICD-10-CM

## 2018-09-13 ENCOUNTER — Telehealth: Payer: Self-pay | Admitting: *Deleted

## 2018-09-13 NOTE — Telephone Encounter (Signed)
Called and spoke w/ [name] (on [place]) about lab results. She verified pt started on leflunomide and I reminded her that she needs to have monthly labs for 5-6 months after starting this medication. She verbalized understanding.

## 2018-09-13 NOTE — Telephone Encounter (Signed)
-----   Message from Asa Lente, MD sent at 09/12/2018  5:32 PM EDT ----- Regarding: labs Please let the patient know that the lab work is fine.

## 2018-10-12 ENCOUNTER — Other Ambulatory Visit: Payer: Self-pay | Admitting: *Deleted

## 2018-10-12 ENCOUNTER — Telehealth: Payer: Self-pay | Admitting: *Deleted

## 2018-10-12 DIAGNOSIS — Z79899 Other long term (current) drug therapy: Secondary | ICD-10-CM

## 2018-10-12 DIAGNOSIS — G35 Multiple sclerosis: Secondary | ICD-10-CM

## 2018-10-12 NOTE — Telephone Encounter (Signed)
Called, LVM for [name] (on [place]) reminding her that Christy Joseph is due for repeat labs. I placed future order in epic for hepatic function panel. Pt on leflunomide and needs monthly labs. Asked her to call back to let me know she received message.  I advised she can come to office for labs as long as she wears mask, she will have temp checked coming into office out front. Need to make sure she has no signs/sx of covid-19.

## 2018-10-12 NOTE — Telephone Encounter (Signed)
Christy Joseph called back and states pt gets labs done monthly via [facility] in [place], [place].  Phone: [phone]. She does not have fax#.  She would like lab orders sent to them so she can have it done there. She is scheduled for tomorrow to have labs. Advised I will call and get fax # and send orders. She needs monthly labs for 5 months to check her hepatic function panel.  I called [facility] and spoke with [facility].  Fax#: [phone], attn: [name], MD.  Advised we would like hepatic function panel monthly x5 months. She will let MD know to look out for order.

## 2018-10-12 NOTE — Telephone Encounter (Signed)
Faxed order to [facility] at [phone] for hepatic function panel to be done monthly for 5 months. Asked them to fax results to [place] each month attn Dr Epimenio Foot at [phone]. Received fax confirmation.

## 2018-11-15 ENCOUNTER — Encounter: Payer: Self-pay | Admitting: Neurology

## 2018-11-16 ENCOUNTER — Telehealth: Payer: Self-pay | Admitting: *Deleted

## 2018-11-16 NOTE — Telephone Encounter (Signed)
Called [facility] and spoke with [place]. Confirmed patient had hepatic function panel last month and again yesterday. They are aware she needed this done monthly for 5 months after starting on leflunomide. She has three more monthly draws left.  She will fax May results to me at [phone] and once results from yesterday are back she will fax those.

## 2018-12-08 ENCOUNTER — Ambulatory Visit: Payer: Medicare (Managed Care) | Admitting: Neurology

## 2018-12-15 ENCOUNTER — Telehealth: Payer: Self-pay | Admitting: *Deleted

## 2018-12-15 NOTE — Telephone Encounter (Signed)
Hold the onstipation med's.   If not better next week, let us know.  For now we will continue leflunomide but reconsider if symptoms persist after the constipation med's are held,

## 2018-12-15 NOTE — Telephone Encounter (Signed)
Dr. Sater- please advise 

## 2018-12-15 NOTE — Telephone Encounter (Signed)
Called again and spoke with [place]. Relayed Dr. Garth Bigness recommendation. She verbalized understanding and appreciation.

## 2018-12-15 NOTE — Telephone Encounter (Signed)
Took call from phone staff. Spoke with Ken Caryl from [place]. She reports pt started having fecal incontinence this past weekend. Wanted to know if Dr. Felecia Shelling felt it was related to her starting on leflunomide. Read this can occur about 2-3 months after starting. Denies pt having fever, labs ok. She will send recent lab results to [place] tomorrow.  She states she can hold her constipation meds.  Call back: [phone].  Advised I will discuss with Dr. Felecia Shelling and call back with recommendation. She verbalized understanding

## 2018-12-15 NOTE — Telephone Encounter (Signed)
Called LVM for Kennyth Lose to call back ([facility])

## 2019-01-12 ENCOUNTER — Ambulatory Visit: Payer: Medicare (Managed Care) | Admitting: Neurology

## 2019-01-30 ENCOUNTER — Other Ambulatory Visit: Payer: Self-pay

## 2019-01-30 ENCOUNTER — Encounter: Payer: Self-pay | Admitting: Neurology

## 2019-01-30 ENCOUNTER — Encounter

## 2019-01-30 ENCOUNTER — Ambulatory Visit (INDEPENDENT_AMBULATORY_CARE_PROVIDER_SITE_OTHER): Payer: Medicare (Managed Care) | Admitting: Neurology

## 2019-01-30 VITALS — BP 162/100 | HR 85 | Temp 97.7°F | Ht 64.0 in | Wt 93.0 lb

## 2019-01-30 DIAGNOSIS — F419 Anxiety disorder, unspecified: Secondary | ICD-10-CM | POA: Diagnosis not present

## 2019-01-30 DIAGNOSIS — I10 Essential (primary) hypertension: Secondary | ICD-10-CM

## 2019-01-30 DIAGNOSIS — R569 Unspecified convulsions: Secondary | ICD-10-CM | POA: Diagnosis not present

## 2019-01-30 DIAGNOSIS — R269 Unspecified abnormalities of gait and mobility: Secondary | ICD-10-CM

## 2019-01-30 DIAGNOSIS — G35 Multiple sclerosis: Secondary | ICD-10-CM | POA: Diagnosis not present

## 2019-01-30 DIAGNOSIS — F09 Unspecified mental disorder due to known physiological condition: Secondary | ICD-10-CM

## 2019-01-30 DIAGNOSIS — F329 Major depressive disorder, single episode, unspecified: Secondary | ICD-10-CM

## 2019-01-30 MED ORDER — DIVALPROEX SODIUM ER 250 MG PO TB24: 500.0000 mg | ORAL_TABLET | Freq: Every day | ORAL | 11 refills | Status: DC

## 2019-01-30 MED ORDER — LEFLUNOMIDE 10 MG PO TABS: ORAL_TABLET | ORAL | 11 refills | Status: AC

## 2019-01-30 NOTE — Progress Notes (Signed)
Faxed printed/signed rx leflunomide 10mg  tablet daily and divalproex ER 250mg  tablet daily and letter from Dr. Felecia Shelling: "In October, check liver function test, CBC w/ diff and depakote level to [facility] at [phone]. Received fax confirmation.   Also faxed prescriptions to [facility] at [phone]. Received fax confirmation.
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
## 2019-02-08 ENCOUNTER — Observation Stay (HOSPITAL_COMMUNITY): Payer: Medicare (Managed Care)

## 2019-02-08 ENCOUNTER — Inpatient Hospital Stay (HOSPITAL_COMMUNITY)
Admission: EM | Admit: 2019-02-08 | Discharge: 2019-02-16 | DRG: 304 | Disposition: A | Discharge status: Skilled Nursing Facility | Payer: Medicare (Managed Care) | Attending: Family Medicine | Admitting: Family Medicine

## 2019-02-08 ENCOUNTER — Other Ambulatory Visit: Payer: Self-pay

## 2019-02-08 ENCOUNTER — Encounter (HOSPITAL_COMMUNITY): Payer: Self-pay

## 2019-02-08 ENCOUNTER — Emergency Department (HOSPITAL_COMMUNITY): Payer: Medicare (Managed Care)

## 2019-02-08 DIAGNOSIS — R471 Dysarthria and anarthria: Secondary | ICD-10-CM | POA: Diagnosis present

## 2019-02-08 DIAGNOSIS — E519 Thiamine deficiency, unspecified: Secondary | ICD-10-CM | POA: Diagnosis present

## 2019-02-08 DIAGNOSIS — W19XXXA Unspecified fall, initial encounter: Secondary | ICD-10-CM | POA: Diagnosis not present

## 2019-02-08 DIAGNOSIS — Z7982 Long term (current) use of aspirin: Secondary | ICD-10-CM

## 2019-02-08 DIAGNOSIS — F329 Major depressive disorder, single episode, unspecified: Secondary | ICD-10-CM | POA: Diagnosis present

## 2019-02-08 DIAGNOSIS — Z807 Family history of other malignant neoplasms of lymphoid, hematopoietic and related tissues: Secondary | ICD-10-CM

## 2019-02-08 DIAGNOSIS — Z833 Family history of diabetes mellitus: Secondary | ICD-10-CM

## 2019-02-08 DIAGNOSIS — G35 Multiple sclerosis: Secondary | ICD-10-CM | POA: Diagnosis present

## 2019-02-08 DIAGNOSIS — Z823 Family history of stroke: Secondary | ICD-10-CM

## 2019-02-08 DIAGNOSIS — W06XXXA Fall from bed, initial encounter: Secondary | ICD-10-CM | POA: Diagnosis present

## 2019-02-08 DIAGNOSIS — Z7983 Long term (current) use of bisphosphonates: Secondary | ICD-10-CM

## 2019-02-08 DIAGNOSIS — Z681 Body mass index (BMI) 19 or less, adult: Secondary | ICD-10-CM

## 2019-02-08 DIAGNOSIS — Z20828 Contact with and (suspected) exposure to other viral communicable diseases: Secondary | ICD-10-CM | POA: Diagnosis present

## 2019-02-08 DIAGNOSIS — F419 Anxiety disorder, unspecified: Secondary | ICD-10-CM | POA: Diagnosis present

## 2019-02-08 DIAGNOSIS — Z9114 Patient's other noncompliance with medication regimen: Secondary | ICD-10-CM

## 2019-02-08 DIAGNOSIS — R52 Pain, unspecified: Secondary | ICD-10-CM | POA: Diagnosis not present

## 2019-02-08 DIAGNOSIS — R5381 Other malaise: Secondary | ICD-10-CM | POA: Diagnosis not present

## 2019-02-08 DIAGNOSIS — Z8673 Personal history of transient ischemic attack (TIA), and cerebral infarction without residual deficits: Secondary | ICD-10-CM

## 2019-02-08 DIAGNOSIS — R269 Unspecified abnormalities of gait and mobility: Secondary | ICD-10-CM

## 2019-02-08 DIAGNOSIS — I160 Hypertensive urgency: Secondary | ICD-10-CM | POA: Diagnosis not present

## 2019-02-08 DIAGNOSIS — F09 Unspecified mental disorder due to known physiological condition: Secondary | ICD-10-CM

## 2019-02-08 DIAGNOSIS — I639 Cerebral infarction, unspecified: Secondary | ICD-10-CM | POA: Diagnosis present

## 2019-02-08 DIAGNOSIS — E7800 Pure hypercholesterolemia, unspecified: Secondary | ICD-10-CM | POA: Diagnosis present

## 2019-02-08 DIAGNOSIS — F32A Depression, unspecified: Secondary | ICD-10-CM | POA: Diagnosis present

## 2019-02-08 DIAGNOSIS — E43 Unspecified severe protein-calorie malnutrition: Secondary | ICD-10-CM | POA: Insufficient documentation

## 2019-02-08 DIAGNOSIS — E876 Hypokalemia: Secondary | ICD-10-CM | POA: Diagnosis present

## 2019-02-08 DIAGNOSIS — Z841 Family history of disorders of kidney and ureter: Secondary | ICD-10-CM

## 2019-02-08 DIAGNOSIS — Z8249 Family history of ischemic heart disease and other diseases of the circulatory system: Secondary | ICD-10-CM

## 2019-02-08 DIAGNOSIS — F17210 Nicotine dependence, cigarettes, uncomplicated: Secondary | ICD-10-CM | POA: Diagnosis present

## 2019-02-08 DIAGNOSIS — E875 Hyperkalemia: Secondary | ICD-10-CM | POA: Diagnosis not present

## 2019-02-08 DIAGNOSIS — Z820 Family history of epilepsy and other diseases of the nervous system: Secondary | ICD-10-CM

## 2019-02-08 DIAGNOSIS — R4189 Other symptoms and signs involving cognitive functions and awareness: Secondary | ICD-10-CM | POA: Diagnosis present

## 2019-02-08 DIAGNOSIS — K219 Gastro-esophageal reflux disease without esophagitis: Secondary | ICD-10-CM | POA: Diagnosis present

## 2019-02-08 DIAGNOSIS — G9349 Other encephalopathy: Secondary | ICD-10-CM | POA: Diagnosis present

## 2019-02-08 DIAGNOSIS — R0902 Hypoxemia: Secondary | ICD-10-CM | POA: Diagnosis not present

## 2019-02-08 DIAGNOSIS — Z8349 Family history of other endocrine, nutritional and metabolic diseases: Secondary | ICD-10-CM

## 2019-02-08 DIAGNOSIS — I10 Essential (primary) hypertension: Secondary | ICD-10-CM | POA: Diagnosis present

## 2019-02-08 DIAGNOSIS — Z825 Family history of asthma and other chronic lower respiratory diseases: Secondary | ICD-10-CM

## 2019-02-08 DIAGNOSIS — R569 Unspecified convulsions: Secondary | ICD-10-CM

## 2019-02-08 DIAGNOSIS — Z8261 Family history of arthritis: Secondary | ICD-10-CM

## 2019-02-08 DIAGNOSIS — E785 Hyperlipidemia, unspecified: Secondary | ICD-10-CM | POA: Diagnosis present

## 2019-02-08 DIAGNOSIS — M25562 Pain in left knee: Secondary | ICD-10-CM

## 2019-02-08 LAB — RAPID URINE DRUG SCREEN, HOSP PERFORMED
Amphetamines: NOT DETECTED
Barbiturates: NOT DETECTED
Benzodiazepines: NOT DETECTED
Cocaine: NOT DETECTED
Opiates: NOT DETECTED
Tetrahydrocannabinol: POSITIVE — AB

## 2019-02-08 LAB — TSH: TSH: 0.768 u[IU]/mL (ref 0.350–4.500)

## 2019-02-08 LAB — URINALYSIS, ROUTINE W REFLEX MICROSCOPIC
Bacteria, UA: NONE SEEN
Bilirubin Urine: NEGATIVE
Glucose, UA: NEGATIVE mg/dL
Hgb urine dipstick: NEGATIVE
Ketones, ur: 5 mg/dL — AB
Leukocytes,Ua: NEGATIVE
Nitrite: NEGATIVE
Protein, ur: 100 mg/dL — AB
Specific Gravity, Urine: 1.014 (ref 1.005–1.030)
pH: 7 (ref 5.0–8.0)

## 2019-02-08 LAB — SARS CORONAVIRUS 2 BY RT PCR (HOSPITAL ORDER, PERFORMED IN ~~LOC~~ HOSPITAL LAB): SARS Coronavirus 2: NEGATIVE

## 2019-02-08 LAB — LIPID PANEL
Cholesterol: 134 mg/dL (ref 0–200)
HDL: 66 mg/dL (ref >40)
LDL Cholesterol: 52 mg/dL (ref 0–99)
Total CHOL/HDL Ratio: 2 RATIO
Triglycerides: 78 mg/dL (ref <150)
VLDL: 16 mg/dL (ref 0–40)

## 2019-02-08 LAB — CBC WITH DIFFERENTIAL/PLATELET
Abs Immature Granulocytes: 0.02 10*3/uL (ref 0.00–0.07)
Basophils Absolute: 0 10*3/uL (ref 0.0–0.1)
Basophils Relative: 1 %
Eosinophils Absolute: 0 10*3/uL (ref 0.0–0.5)
Eosinophils Relative: 1 %
HCT: 40.5 % (ref 36.0–46.0)
Hemoglobin: 12.7 g/dL (ref 12.0–15.0)
Immature Granulocytes: 0 %
Lymphocytes Relative: 32 %
Lymphs Abs: 2 10*3/uL (ref 0.7–4.0)
MCH: 30.5 pg (ref 26.0–34.0)
MCHC: 31.4 g/dL (ref 30.0–36.0)
MCV: 97.4 fL (ref 80.0–100.0)
Monocytes Absolute: 0.3 10*3/uL (ref 0.1–1.0)
Monocytes Relative: 5 %
Neutro Abs: 4 10*3/uL (ref 1.7–7.7)
Neutrophils Relative %: 61 %
Platelets: 208 10*3/uL (ref 150–400)
RBC: 4.16 MIL/uL (ref 3.87–5.11)
RDW: 15 % (ref 11.5–15.5)
WBC: 6.5 10*3/uL (ref 4.0–10.5)
nRBC: 0 % (ref 0.0–0.2)

## 2019-02-08 LAB — BASIC METABOLIC PANEL
Anion gap: 12 (ref 5–15)
BUN: 12 mg/dL (ref 6–20)
CO2: 27 mmol/L (ref 22–32)
Calcium: 9.5 mg/dL (ref 8.9–10.3)
Chloride: 105 mmol/L (ref 98–111)
Creatinine, Ser: 0.94 mg/dL (ref 0.44–1.00)
GFR calc Af Amer: >60 mL/min (ref >60)
GFR calc non Af Amer: >60 mL/min (ref >60)
Glucose, Bld: 87 mg/dL (ref 70–99)
Potassium: 3.3 mmol/L — ABNORMAL LOW (ref 3.5–5.1)
Sodium: 144 mmol/L (ref 135–145)

## 2019-02-08 LAB — VALPROIC ACID LEVEL: Valproic Acid Lvl: 14 ug/mL — ABNORMAL LOW (ref 50.0–100.0)

## 2019-02-08 LAB — TROPONIN I (HIGH SENSITIVITY): Troponin I (High Sensitivity): 38 ng/L — ABNORMAL HIGH (ref <18)

## 2019-02-08 LAB — VITAMIN B12: Vitamin B-12: 2120 pg/mL — ABNORMAL HIGH (ref 180–914)

## 2019-02-08 MED ORDER — BUPROPION HCL 100 MG PO TABS
100.0000 mg | ORAL_TABLET | Freq: Every day | ORAL | Status: DC
  Administered 2019-02-08 – 2019-02-16 (×9): 100 mg via ORAL
  Filled 2019-02-08 (×11): qty 1

## 2019-02-08 MED ORDER — AMLODIPINE BESYLATE 5 MG PO TABS
5.0000 mg | ORAL_TABLET | Freq: Every day | ORAL | Status: DC
  Administered 2019-02-08 – 2019-02-09 (×2): 5 mg via ORAL
  Filled 2019-02-08 (×2): qty 1

## 2019-02-08 MED ORDER — ACETAMINOPHEN 650 MG RE SUPP: 650.0000 mg | Freq: Four times a day (QID) | RECTAL | Status: DC | PRN

## 2019-02-08 MED ORDER — HYDRALAZINE HCL 20 MG/ML IJ SOLN
10.0000 mg | Freq: Once | INTRAMUSCULAR | Status: AC
  Administered 2019-02-08: 15:00:00 10 mg via INTRAVENOUS
  Filled 2019-02-08: qty 1

## 2019-02-08 MED ORDER — ZOLPIDEM TARTRATE 5 MG PO TABS: 5.0000 mg | ORAL_TABLET | Freq: Once | ORAL | Status: DC

## 2019-02-08 MED ORDER — POLYETHYLENE GLYCOL 3350 17 G PO PACK: 17.0000 g | PACK | Freq: Every day | ORAL | Status: DC | PRN

## 2019-02-08 MED ORDER — ROSUVASTATIN CALCIUM 10 MG PO TABS
5.0000 mg | ORAL_TABLET | Freq: Every day | ORAL | Status: DC
  Administered 2019-02-08 – 2019-02-15 (×8): 5 mg via ORAL
  Filled 2019-02-08 (×9): qty 1

## 2019-02-08 MED ORDER — SERTRALINE HCL 50 MG PO TABS
150.0000 mg | ORAL_TABLET | Freq: Every day | ORAL | Status: DC
  Administered 2019-02-08 – 2019-02-16 (×9): 150 mg via ORAL
  Filled 2019-02-08 (×9): qty 3

## 2019-02-08 MED ORDER — SENNOSIDES-DOCUSATE SODIUM 8.6-50 MG PO TABS: 2.0000 | ORAL_TABLET | Freq: Every evening | ORAL | Status: DC | PRN

## 2019-02-08 MED ORDER — ONDANSETRON HCL 4 MG/2ML IJ SOLN: 4.0000 mg | Freq: Four times a day (QID) | INTRAMUSCULAR | Status: DC | PRN

## 2019-02-08 MED ORDER — POTASSIUM CHLORIDE CRYS ER 20 MEQ PO TBCR: 20.0000 meq | EXTENDED_RELEASE_TABLET | Freq: Every day | ORAL | 0 refills | Status: DC

## 2019-02-08 MED ORDER — LOSARTAN POTASSIUM 50 MG PO TABS
75.0000 mg | ORAL_TABLET | Freq: Every day | ORAL | Status: DC
  Administered 2019-02-09 – 2019-02-10 (×2): 75 mg via ORAL
  Filled 2019-02-08 (×2): qty 2

## 2019-02-08 MED ORDER — HYDROCHLOROTHIAZIDE 25 MG PO TABS: 25.0000 mg | ORAL_TABLET | Freq: Every day | ORAL | 0 refills | Status: DC

## 2019-02-08 MED ORDER — POTASSIUM CHLORIDE 20 MEQ/15ML (10%) PO SOLN
40.0000 meq | Freq: Once | ORAL | Status: AC
  Administered 2019-02-08: 40 meq via ORAL
  Filled 2019-02-08: qty 30

## 2019-02-08 MED ORDER — LABETALOL HCL 5 MG/ML IV SOLN
10.0000 mg | Freq: Once | INTRAVENOUS | Status: AC
  Administered 2019-02-08: 11:00:00 10 mg via INTRAVENOUS
  Filled 2019-02-08: qty 4

## 2019-02-08 MED ORDER — ONDANSETRON HCL 4 MG PO TABS: 4.0000 mg | ORAL_TABLET | Freq: Four times a day (QID) | ORAL | Status: DC | PRN

## 2019-02-08 MED ORDER — ACETAMINOPHEN 325 MG PO TABS
650.0000 mg | ORAL_TABLET | Freq: Four times a day (QID) | ORAL | Status: DC | PRN
  Administered 2019-02-10: 650 mg via ORAL
  Filled 2019-02-08 (×2): qty 2

## 2019-02-08 MED ORDER — FAMOTIDINE 20 MG PO TABS
20.0000 mg | ORAL_TABLET | Freq: Every day | ORAL | Status: DC
  Administered 2019-02-08 – 2019-02-16 (×9): 20 mg via ORAL
  Filled 2019-02-08 (×9): qty 1

## 2019-02-08 MED ORDER — ASPIRIN 325 MG PO TABS
650.0000 mg | ORAL_TABLET | Freq: Every day | ORAL | Status: DC
  Administered 2019-02-09 – 2019-02-16 (×8): 650 mg via ORAL
  Filled 2019-02-08 (×9): qty 2

## 2019-02-08 MED ORDER — NICOTINE 21 MG/24HR TD PT24
21.0000 mg | MEDICATED_PATCH | Freq: Every day | TRANSDERMAL | Status: DC
  Administered 2019-02-08 – 2019-02-16 (×9): 21 mg via TRANSDERMAL
  Filled 2019-02-08 (×9): qty 1

## 2019-02-08 MED ORDER — HYDRALAZINE HCL 20 MG/ML IJ SOLN
10.0000 mg | INTRAMUSCULAR | Status: DC | PRN
  Administered 2019-02-08 – 2019-02-13 (×6): 10 mg via INTRAVENOUS
  Filled 2019-02-08 (×6): qty 1

## 2019-02-08 MED ORDER — GABAPENTIN 300 MG PO CAPS
300.0000 mg | ORAL_CAPSULE | Freq: Every day | ORAL | Status: DC
  Administered 2019-02-08 – 2019-02-16 (×9): 300 mg via ORAL
  Filled 2019-02-08 (×9): qty 1

## 2019-02-08 MED ORDER — LABETALOL HCL 5 MG/ML IV SOLN
10.0000 mg | Freq: Once | INTRAVENOUS | Status: AC
  Administered 2019-02-08: 10 mg via INTRAVENOUS
  Filled 2019-02-08: qty 4

## 2019-02-08 MED ORDER — DIVALPROEX SODIUM ER 500 MG PO TB24
500.0000 mg | ORAL_TABLET | Freq: Every day | ORAL | Status: DC
  Administered 2019-02-09 – 2019-02-16 (×8): 500 mg via ORAL
  Filled 2019-02-08 (×9): qty 1

## 2019-02-08 MED ORDER — ENOXAPARIN SODIUM 40 MG/0.4ML ~~LOC~~ SOLN
40.0000 mg | SUBCUTANEOUS | Status: DC
  Administered 2019-02-08 – 2019-02-15 (×8): 40 mg via SUBCUTANEOUS
  Filled 2019-02-08 (×8): qty 0.4

## 2019-02-08 MED ORDER — VITAMIN B-12 1000 MCG PO TABS
1000.0000 ug | ORAL_TABLET | Freq: Every day | ORAL | Status: DC
  Administered 2019-02-08 – 2019-02-16 (×9): 1000 ug via ORAL
  Filled 2019-02-08 (×9): qty 1

## 2019-02-08 NOTE — ED Notes (Signed)
Patient transported to CT 

## 2019-02-08 NOTE — ED Notes (Signed)
Pt returned from CT °
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
## 2019-02-08 NOTE — ED Notes (Addendum)
Pt ambulated a few steps in room with 2 assists.  Pt c/o back pain.  Pt's sister witnessed pt ambulating with staff and says pt usually able to walk better than she was able to in the room.  Pt was very dependent on staff to help walk.  PA aware.  Also PA aware of BP.

## 2019-02-08 NOTE — Progress Notes (Signed)
CRITICAL VALUE ALERT  Critical Value:  Troponin I 38  Date & Time Notied:  02/08/2019  Provider Notified: Tylene Fantasia  Orders Received/Actions taken: awaiting orders

## 2019-02-08 NOTE — ED Notes (Signed)
Have notified Tammy PA about patient's blood pressure.

## 2019-02-08 NOTE — Discharge Instructions (Addendum)
Your blood pressure today was severely elevated.  It is important that you take your blood pressure medications every day as directed.  I have also added an additional blood pressure medication for you to take once a day.  Follow-up with your primary doctor for recheck of your blood pressure next week.  Return to the ER for any worsening symptoms.
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
## 2019-02-08 NOTE — ED Notes (Signed)
Patient was able to use both legs in pushing up in the bed and assisting with linen change.

## 2019-02-08 NOTE — ED Triage Notes (Signed)
Pt fell off the bed this morning and is complaining of left knee pain. Hx of MS. Having more trouble ambulating. Pt very hypertensive, but also hasn't had her morning meds. BP 215/111.

## 2019-02-09 ENCOUNTER — Observation Stay (HOSPITAL_COMMUNITY): Payer: Medicare (Managed Care)

## 2019-02-09 DIAGNOSIS — R079 Chest pain, unspecified: Secondary | ICD-10-CM | POA: Diagnosis not present

## 2019-02-09 DIAGNOSIS — E43 Unspecified severe protein-calorie malnutrition: Secondary | ICD-10-CM | POA: Diagnosis present

## 2019-02-09 DIAGNOSIS — Z7982 Long term (current) use of aspirin: Secondary | ICD-10-CM | POA: Diagnosis not present

## 2019-02-09 DIAGNOSIS — Z8349 Family history of other endocrine, nutritional and metabolic diseases: Secondary | ICD-10-CM | POA: Diagnosis not present

## 2019-02-09 DIAGNOSIS — Z20828 Contact with and (suspected) exposure to other viral communicable diseases: Secondary | ICD-10-CM | POA: Diagnosis present

## 2019-02-09 DIAGNOSIS — Z681 Body mass index (BMI) 19 or less, adult: Secondary | ICD-10-CM | POA: Diagnosis not present

## 2019-02-09 DIAGNOSIS — Z820 Family history of epilepsy and other diseases of the nervous system: Secondary | ICD-10-CM | POA: Diagnosis not present

## 2019-02-09 DIAGNOSIS — Z8673 Personal history of transient ischemic attack (TIA), and cerebral infarction without residual deficits: Secondary | ICD-10-CM | POA: Diagnosis not present

## 2019-02-09 DIAGNOSIS — Z841 Family history of disorders of kidney and ureter: Secondary | ICD-10-CM | POA: Diagnosis not present

## 2019-02-09 DIAGNOSIS — E7800 Pure hypercholesterolemia, unspecified: Secondary | ICD-10-CM | POA: Diagnosis present

## 2019-02-09 DIAGNOSIS — I160 Hypertensive urgency: Secondary | ICD-10-CM | POA: Diagnosis present

## 2019-02-09 DIAGNOSIS — Z8249 Family history of ischemic heart disease and other diseases of the circulatory system: Secondary | ICD-10-CM | POA: Diagnosis not present

## 2019-02-09 DIAGNOSIS — F419 Anxiety disorder, unspecified: Secondary | ICD-10-CM | POA: Diagnosis present

## 2019-02-09 DIAGNOSIS — E519 Thiamine deficiency, unspecified: Secondary | ICD-10-CM | POA: Diagnosis present

## 2019-02-09 DIAGNOSIS — E785 Hyperlipidemia, unspecified: Secondary | ICD-10-CM | POA: Diagnosis present

## 2019-02-09 DIAGNOSIS — F329 Major depressive disorder, single episode, unspecified: Secondary | ICD-10-CM | POA: Diagnosis present

## 2019-02-09 DIAGNOSIS — I10 Essential (primary) hypertension: Secondary | ICD-10-CM | POA: Diagnosis present

## 2019-02-09 DIAGNOSIS — Z8261 Family history of arthritis: Secondary | ICD-10-CM | POA: Diagnosis not present

## 2019-02-09 DIAGNOSIS — Z825 Family history of asthma and other chronic lower respiratory diseases: Secondary | ICD-10-CM | POA: Diagnosis not present

## 2019-02-09 DIAGNOSIS — Z833 Family history of diabetes mellitus: Secondary | ICD-10-CM | POA: Diagnosis not present

## 2019-02-09 DIAGNOSIS — G9349 Other encephalopathy: Secondary | ICD-10-CM | POA: Diagnosis present

## 2019-02-09 DIAGNOSIS — G35 Multiple sclerosis: Secondary | ICD-10-CM | POA: Diagnosis present

## 2019-02-09 DIAGNOSIS — W06XXXA Fall from bed, initial encounter: Secondary | ICD-10-CM | POA: Diagnosis present

## 2019-02-09 DIAGNOSIS — Z807 Family history of other malignant neoplasms of lymphoid, hematopoietic and related tissues: Secondary | ICD-10-CM | POA: Diagnosis not present

## 2019-02-09 DIAGNOSIS — Z7983 Long term (current) use of bisphosphonates: Secondary | ICD-10-CM | POA: Diagnosis not present

## 2019-02-09 DIAGNOSIS — I639 Cerebral infarction, unspecified: Secondary | ICD-10-CM | POA: Diagnosis present

## 2019-02-09 DIAGNOSIS — Z823 Family history of stroke: Secondary | ICD-10-CM | POA: Diagnosis not present

## 2019-02-09 LAB — BASIC METABOLIC PANEL
Anion gap: 15 (ref 5–15)
BUN: 16 mg/dL (ref 6–20)
CO2: 20 mmol/L — ABNORMAL LOW (ref 22–32)
Calcium: 9.5 mg/dL (ref 8.9–10.3)
Chloride: 104 mmol/L (ref 98–111)
Creatinine, Ser: 0.9 mg/dL (ref 0.44–1.00)
GFR calc Af Amer: >60 mL/min (ref >60)
GFR calc non Af Amer: >60 mL/min (ref >60)
Glucose, Bld: 114 mg/dL — ABNORMAL HIGH (ref 70–99)
Potassium: 3.6 mmol/L (ref 3.5–5.1)
Sodium: 139 mmol/L (ref 135–145)

## 2019-02-09 LAB — CBC
HCT: 45.9 % (ref 36.0–46.0)
Hemoglobin: 14.7 g/dL (ref 12.0–15.0)
MCH: 30.5 pg (ref 26.0–34.0)
MCHC: 32 g/dL (ref 30.0–36.0)
MCV: 95.2 fL (ref 80.0–100.0)
Platelets: 302 10*3/uL (ref 150–400)
RBC: 4.82 MIL/uL (ref 3.87–5.11)
RDW: 15.2 % (ref 11.5–15.5)
WBC: 7.8 10*3/uL (ref 4.0–10.5)
nRBC: 0 % (ref 0.0–0.2)

## 2019-02-09 LAB — ECHOCARDIOGRAM COMPLETE
Height: 64 in
Weight: 1840 oz

## 2019-02-09 LAB — TROPONIN I (HIGH SENSITIVITY): Troponin I (High Sensitivity): 39 ng/L — ABNORMAL HIGH (ref <18)

## 2019-02-09 LAB — MAGNESIUM: Magnesium: 1.9 mg/dL (ref 1.7–2.4)

## 2019-02-09 MED ORDER — AMLODIPINE BESYLATE 5 MG PO TABS
10.0000 mg | ORAL_TABLET | Freq: Every day | ORAL | Status: DC
  Administered 2019-02-10 – 2019-02-16 (×7): 10 mg via ORAL
  Filled 2019-02-09 (×8): qty 2

## 2019-02-09 MED ORDER — GADOBUTROL 1 MMOL/ML IV SOLN
5.0000 mL | Freq: Once | INTRAVENOUS | Status: AC | PRN
  Administered 2019-02-09: 5 mL via INTRAVENOUS

## 2019-02-09 MED ORDER — SODIUM CHLORIDE 0.9 % IV SOLN
1000.0000 mg | INTRAVENOUS | Status: DC
  Administered 2019-02-10 – 2019-02-11 (×3): 1000 mg via INTRAVENOUS
  Filled 2019-02-09 (×4): qty 8

## 2019-02-09 MED ORDER — THIAMINE HCL 100 MG/ML IJ SOLN
100.0000 mg | Freq: Every day | INTRAMUSCULAR | Status: DC
  Administered 2019-02-09 – 2019-02-11 (×3): 100 mg via INTRAVENOUS
  Filled 2019-02-09 (×3): qty 2

## 2019-02-09 MED ORDER — AMLODIPINE BESYLATE 5 MG PO TABS
5.0000 mg | ORAL_TABLET | Freq: Every day | ORAL | Status: DC
  Administered 2019-02-09: 5 mg via ORAL
  Filled 2019-02-09: qty 1

## 2019-02-09 MED ORDER — AMLODIPINE BESYLATE 5 MG PO TABS: 10.0000 mg | ORAL_TABLET | Freq: Every day | ORAL | Status: DC

## 2019-02-09 NOTE — Plan of Care (Signed)
  Problem: Acute Rehab PT Goals(only PT should resolve) Goal: Pt Will Go Supine/Side To Sit Outcome: Progressing [place] (Taken 02/09/2019 1207) Pt will go Supine/Side to Sit:  with modified independence  with supervision Goal: Patient Will Transfer Sit To/From Stand Outcome: Progressing [place] (Taken 02/09/2019 1207) Patient will transfer sit to/from stand: with min guard assist Goal: Pt Will Transfer Bed To Chair/Chair To Bed Outcome: Progressing Flowsheets (Taken 02/09/2019 1207) Pt will Transfer Bed to Chair/Chair to Bed: min guard assist Goal: Pt Will Ambulate Outcome: Progressing Flowsheets (Taken 02/09/2019 1207) Pt will Ambulate:  100 feet  with min guard assist  with minimal assist  with rolling walker  with cane   12:08 PM, 02/09/19 Lonell Grandchild, MPT Physical Therapist with [facility] [phone] office [phone] mobile phone

## 2019-02-09 NOTE — Progress Notes (Signed)
*  PRELIMINARY RESULTS* Echocardiogram 2D Echocardiogram has been performed.  Leavy Cella 02/09/2019, 3:22 PM

## 2019-02-09 NOTE — Progress Notes (Signed)
Pt admitted from ED. Orthostatic vital signs ordered. Unable to complete at this time r/t pt unable to stand for extended amount of time from multiple sclerosis. Will attempt later in shift.

## 2019-02-09 NOTE — Progress Notes (Signed)
PROGRESS NOTE    Christy Joseph  KVQ:[id number] DOB: 1960/06/14 DOA: 02/08/2019 PCP: [facility]   Brief Narrative:  Per HPI: Christy Joseph is a 58 y.o. female with medical history significant of multiple sclerosis, CVA, seizure disorder, HTN, GERD, tobacco abuse who presents to ED with progressive weakness, confusion, and fall.  Patient with baseline confusion that has progressed over the past few weeks, much of the history is obtained from her caregiver who is at bedside.  Caregiver reports that patient fell out of bed this morning with a subsequent fall later this afternoon.  She has noticed that she has been more confused over the last 2 days with increased weakness and bilateral leg pain.  Caregiver also reports that she has been losing a fairly significant amount of weight over the past several weeks to months with decreased oral appetite and concerns about her swallowing ability especially with her home medications.   Patient is followed for her underlying multiple sclerosis by [facility] with recent reduction in her leflunomide to 10 mg p.o. daily for GI side effects and her Keppra was changed to Depakote 500 mg p.o. daily which was on 01/30/2019.  ED provider reported that she has not been taking her medications in a few days, although her caregiver did state that she did today.  Unclear whether she took her medications today, but suspect she has not given her severe elevated blood pressure.  9/10: Patient seen and evaluated and continues to have elevated blood pressure readings as well as some confusion.  After discussion with the husband and caretaker today, it appears that there may be a combination of medication noncompliance, ongoing marijuana use, as well as recent medication changes by the neurologist that may have contributed to her symptoms.  PT has evaluated the patient with recommendations for SNF on discharge.  Assessment &  Plan:   Principal Problem:   Hypertensive urgency Active Problems:   Essential hypertension, benign   Hyperlipidemia LDL goal <130   Anxiety and depression   Seizure (HCC)   Multiple sclerosis (HCC)   Gait disturbance   Cognitive deficit secondary to multiple sclerosis (HCC)   CVA (cerebral vascular accident) (HCC)   Hypertensive crisis-improving -Possibly secondary to medication noncompliance --Restart home losartan 75 mg p.o. daily --Adjust amlodipine to 10 mg p.o. daily --Hydralazine 10 mg IV every 4 hours prn --Continue monitor blood pressure closely and adjust accordingly  Chest pain-resolved likely secondary to above --Troponin trend flat --Continue to monitor on telemetry --check echo, does not appear that she has had a previous exam in EMR --Continue aspirin and statin, currently not on a beta-blocker --Check lipid panel --morphine IV prn, nitro prn  Acute encephalopathy likely secondary to hypertensive crisis-improving - Urinalysis unrevealing.  Electrolytes stable. --CT head and brain MRI with no acute findings --UDS positive for marijuana use --Depakote level is subtherapeutic at 14 --Supportive care  Hypokalemia-resolved -Recheck in a.m.  History of multiple sclerosis Diagnosed initially in 2012 following presentation with decreased balance and gait.  Now with asymmetric weakness and falls.  Recent changes in her medications with a decrease of leflunomide to 10 mg p.o. daily on 01/30/2019.  Patient utilizes a walker at baseline. --Continue leflunomide at 10 mg p.o. daily --MRI of brain without active flare noted --Neurology consultation for evaluation and further recommendations --PT/OT/speech therapy consultation recommending SNF on discharge  GERD --Continue Pepcid  Tobacco use disorder with ongoing marijuana use Patient continues to smoke  roughly 1/2 packs/day.  Counseled patient although, confused even at baseline. --Nicotine patch  Hx CVA  Hx Seizure disorder Currently on Depakote 500 mg p.o. daily with recent change from [place] by Dr. Mel[facility] on 01/30/2019. --Continue Depakote 500 mg p.o. daily; appreciate neurology evaluation --Continue gabapentin --Continue aspirin and statin  Weight loss Caregiver reports poor appetite and significant weight loss over the past weeks to month. --Nutrition consult for evaluation -Likely related to marijuana use   DVT prophylaxis:Lovenox Code Status: Full Family Communication: Discussed on phone with husband and sister. Disposition Plan: Awaiting neurology evaluation.  PT recommending placement to SNF.   Consultants:   Neurology  Procedures:   As noted below  Antimicrobials:   None   Subjective: Patient seen and evaluated today with complaints of allover body aches and pain.  She was noted to still have significant weakness with ambulation and elevated blood pressure readings which are now starting to improve.  Objective: Vitals:   02/09/19 0143 02/09/19 0245 02/09/19 0539 02/09/19 0811  BP: (!) 169/124 (!) 174/112 (!) 165/114 (!) 140/100  Pulse: 90 97 100 (!) 110  Resp:   16   Temp:   98 F (36.7 C)   TempSrc:   Oral   SpO2:   97% 100%  Weight:      Height:        Intake/Output Summary (Last 24 hours) at 02/09/2019 1235 Last data filed at 02/09/2019 0900 Gross per 24 hour  Intake 120 ml  Output 200 ml  Net -80 ml   Filed Weights   02/08/19 1035  Weight: 52.2 kg    Examination:  General exam: Appears calm and comfortable  Respiratory system: Clear to auscultation. Respiratory effort normal. Cardiovascular system: S1 & S2 heard, RRR. No JVD, murmurs, rubs, gallops or clicks. No pedal edema. Gastrointestinal system: Abdomen is nondistended, soft and nontender. No organomegaly or masses felt. Normal bowel sounds heard. Central nervous system: Alert and awake Extremities: No significant edema Skin: No rashes, lesions or ulcers  Psychiatry: Flat affect    Data Reviewed: I have personally reviewed following labs and imaging studies  CBC: Recent Labs  Lab 02/08/19 1354 02/09/19 0706  WBC 6.5 7.8  NEUTROABS 4.0  --   HGB 12.7 14.7  HCT 40.5 45.9  MCV 97.4 95.2  PLT 208 518   Basic Metabolic Panel: Recent Labs  Lab 02/08/19 1354 02/09/19 0706  NA 144 139  K 3.3* 3.6  CL 105 104  CO2 27 20*  GLUCOSE 87 114*  BUN 12 16  CREATININE 0.94 0.90  CALCIUM 9.5 9.5  MG  --  1.9   GFR: Estimated Creatinine Clearance: 56.1 mL/min (by C-G formula based on SCr of 0.9 mg/dL). Liver Function Tests: No results for input(s): AST, ALT, ALKPHOS, BILITOT, PROT, ALBUMIN in the last 168 hours. No results for input(s): LIPASE, AMYLASE in the last 168 hours. No results for input(s): AMMONIA in the last 168 hours. Coagulation Profile: No results for input(s): INR, PROTIME in the last 168 hours. Cardiac Enzymes: No results for input(s): CKTOTAL, CKMB, CKMBINDEX, TROPONINI in the last 168 hours. BNP (last 3 results) No results for input(s): PROBNP in the last 8760 hours. HbA1C: No results for input(s): HGBA1C in the last 72 hours. CBG: No results for input(s): GLUCAP in the last 168 hours. Lipid Profile: Recent Labs    02/08/19 1354  CHOL 134  HDL 66  LDLCALC 52  TRIG 78  CHOLHDL 2.0   Thyroid  Function Tests: Recent Labs    02/08/19 1354  TSH 0.768   Anemia Panel: Recent Labs    02/08/19 1354  VITAMINB12 2,120*   Sepsis Labs: No results for input(s): PROCALCITON, LATICACIDVEN in the last 168 hours.  Recent Results (from the past 240 hour(s))  SARS Coronavirus 2 [facility](Hospital order, [facility]) Nasopharyngeal Nasopharyngeal Swab     Status: None   Collection Time: 02/08/19  5:02 PM   Specimen: Nasopharyngeal Swab  Result Value Ref Range Status   SARS Coronavirus 2 NEGATIVE NEGATIVE Final    Comment: (NOTE) If result is NEGATIVE SARS-CoV-2 target nucleic acids are NOT  DETECTED. The SARS-CoV-2 RNA is generally detectable in upper and lower  respiratory specimens during the acute phase of infection. The lowest  concentration of SARS-CoV-2 viral copies this assay can detect is 250  copies / mL. A negative result does not preclude SARS-CoV-2 infection  and should not be used as the sole basis for treatment or other  patient management decisions.  A negative result may occur with  improper specimen collection / handling, submission of specimen other  than nasopharyngeal swab, presence of viral mutation(s) within the  areas targeted by this assay, and inadequate number of viral copies  (<250 copies / mL). A negative result must be combined with clinical  observations, patient history, and epidemiological information. If result is POSITIVE SARS-CoV-2 target nucleic acids are DETECTED. The SARS-CoV-2 RNA is generally detectable in upper and lower  respiratory specimens dur ing the acute phase of infection.  Positive  results are indicative of active infection with SARS-CoV-2.  Clinical  correlation with patient history and other diagnostic information is  necessary to determine patient infection status.  Positive results do  not rule out bacterial infection or co-infection with other viruses. If result is PRESUMPTIVE POSTIVE SARS-CoV-2 nucleic acids MAY BE PRESENT.   A presumptive positive result was obtained on the submitted specimen  and confirmed on repeat testing.  While 2019 novel coronavirus  (SARS-CoV-2) nucleic acids may be present in the submitted sample  additional confirmatory testing may be necessary for epidemiological  and / or clinical management purposes  to differentiate between  SARS-CoV-2 and other Sarbecovirus currently known to infect humans.  If clinically indicated additional testing with an alternate test  methodology [phone]([id number]) is advised. The SARS-CoV-2 RNA is generally  detectable in upper and lower respiratory sp ecimens during  the acute  phase of infection. The expected result is Negative. Fact Sheet for Patients:  BoilerBrush.com.cyhttps://www.fda.gov/media/136312/download Fact Sheet for Healthcare Providers: https://pope.com/https://www.fda.gov/media/136313/download This test is not yet approved or cleared by the [place] FDA and has been authorized for detection and/or diagnosis of SARS-CoV-2 by FDA under an Emergency Use Authorization (EUA).  This EUA will remain in effect (meaning this test can be used) for the duration of the COVID-19 declaration under Section 564(b)(1) of the Act, 21 U.S.C. section 360bbb-3(b)(1), unless the authorization is terminated or revoked sooner. Performed at [facility], [address] [place], [place] [address]          Radiology Studies: Ct Head Wo Contrast  Result Date: 02/08/2019 CLINICAL DATA:  Fall.  Altered level of consciousness. EXAM: CT HEAD WITHOUT CONTRAST TECHNIQUE: Contiguous axial images were obtained from the base of the skull through the vertex without intravenous contrast. COMPARISON:  01/08/2012 FINDINGS: Brain: Advanced atrophy and chronic small vessel disease throughout the deep white matter, significantly progressed since prior study. No acute intracranial abnormality. Specifically, no hemorrhage,  hydrocephalus, mass lesion, acute infarction, or significant intracranial injury. Vascular: No hyperdense vessel or unexpected calcification. Skull: No acute calvarial abnormality. Sinuses/Orbits: Visualized paranasal sinuses and mastoids clear. Orbital soft tissues unremarkable. Other: None IMPRESSION: Advanced atrophy and chronic small vessel disease. No acute intracranial abnormality Electronically Signed   By: Charlett NoseKevin  Dover M.D.   On: 02/08/2019 19:29   Mr Laqueta JeanBrain W ZOWo Contrast  Result Date: 02/09/2019 CLINICAL DATA:  58 year old female with recent fall and altered mental status. History of multiple sclerosis, query disease flare up. EXAM: MRI HEAD WITHOUT AND WITH CONTRAST TECHNIQUE:  Multiplanar, multiecho pulse sequences of the brain and surrounding structures were obtained without and with intravenous contrast. CONTRAST:  5mL GADAVIST GADOBUTROL 1 MMOL/ML IV SOLN COMPARISON:  No prior MRI.  Head CTs 02/08/2019 and earlier. FINDINGS: Brain: No restricted diffusion or evidence of acute infarction. Confluent and severe abnormal T2 and FLAIR hyperintensity throughout much of the cerebral white matter, with severe atrophy of the corpus callosum (series 12, image 14). Some small areas of cortical involvement are noted (left frontal lobe series 9, image 38). Deep white matter capsule involvement, especially the external capsules. Multifocal T2 heterogeneity in the bilateral deep gray nuclei, a especially the medial thalami (series 7, image 12). Confluent and severe T2 and FLAIR hyperintensity throughout the pons and medulla (series 9, image 13 and series 12, image 14) where diffusion appears facilitated. No associated brainstem enlargement. Comparatively mild scattered T2 and FLAIR hyperintensity in the cerebellum. The T2 signal abnormality continues to the cervicomedullary junction. No abnormal enhancement identified.  No dural thickening. No midline shift, mass effect, evidence of mass lesion, ventriculomegaly, extra-axial collection or acute intracranial hemorrhage. Pituitary within normal limits. Vascular: Loss of the distal left vertebral artery flow void in keeping with poor flow or thrombosis on series 7, image 3. There is some preserved enhancement of the distal left vertebral artery following contrast. The distal right vertebral artery and other Major intracranial vascular flow voids are preserved. The major dural venous sinuses are enhancing and appear to be patent. Skull and upper cervical spine: Grossly negative visible cervical spine and spinal cord. Visualized bone marrow signal is within normal limits. Sinuses/Orbits: Postoperative changes to both globes. Possible asymmetric T2  hyperintensity of the right optic nerve. Paranasal sinuses are clear. Other: Mastoids are clear. Visible internal auditory structures appear normal. Scalp and face soft tissues appear negative. IMPRESSION: 1. Confluent and severe signal changes in the brain - including the brainstem - are nonspecific but compatible with advanced chronic demyelinating disease. No evidence of active demyelination. 2. Abnormal distal left vertebral artery with poor flow or less likely occlusion. No evidence of acute infarct. CTA or MRA could evaluate further. Electronically Signed   By: Odessa FlemingH  Hall M.D.   On: 02/09/2019 10:12   Dg Knee Left Port  Result Date: 02/08/2019 CLINICAL DATA:  Pain.  History of MS. EXAM: PORTABLE LEFT KNEE - 1-2 VIEW COMPARISON:  None. FINDINGS: No evidence of fracture, dislocation, or joint effusion. No evidence of arthropathy or other focal bone abnormality. Soft tissues are unremarkable. IMPRESSION: Negative. Electronically Signed   By: Ted Mcalpineobrinka  Dimitrova M.D.   On: 02/08/2019 11:45        Scheduled Meds: . [START ON 02/10/2019] amLODipine  10 mg Oral Daily  . amLODipine  5 mg Oral Daily  . aspirin  650 mg Oral Daily  . buPROPion  100 mg Oral Daily  . divalproex  500 mg Oral Daily  . enoxaparin (LOVENOX) injection  40 mg Subcutaneous Q24H  . famotidine  20 mg Oral Daily  . gabapentin  300 mg Oral Daily  . losartan  75 mg Oral Daily  . nicotine  21 mg Transdermal Daily  . rosuvastatin  5 mg Oral q1800  . sertraline  150 mg Oral Daily  . vitamin B-12  1,000 mcg Oral Daily   Continuous Infusions:   LOS: 0 days    Time spent: 30 minutes    Rosalena Mccorry Hoover BrunetteD Blimie Vaness, DO [facility] Pager [phone]  If 7PM-7AM, please contact night-coverage [email or web address] Password [email or web address] 02/09/2019, 12:35 PM

## 2019-02-09 NOTE — Evaluation (Signed)
Occupational Therapy Evaluation Patient Details Name: Christy Joseph MRN: [id number] DOB: March 14, 1961 Today's Date: 02/09/2019    History of Present Illness Anniebelle B Joseph is a 58 y.o. female with medical history significant of multiple sclerosis, CVA, seizure disorder, HTN, GERD, tobacco abuse who presents to ED with progressive weakness, confusion, and fall.  Patient with baseline confusion that has progressed over the past few weeks, much of the history is obtained from her caregiver who is at bedside.  Caregiver reports that patient fell out of bed this morning with a subsequent fall later this afternoon.  She has noticed that she has been more confused over the last 2 days with increased weakness and bilateral leg pain.  Caregiver also reports that she has been losing a fairly significant amount of weight over the past several weeks to months with decreased oral appetite and concerns about her swallowing ability especially with her home medications.    Clinical Impression   Pt agreeable to OT evaluation this am. Pt with some confusion, accuracy of PLOF information is questionable, however no family in room to assist. Pt with fear of falling during session, requiring increased time and verbal cuing for sequencing during all tasks. Pt with generalized weakness in BUE. Recommend SNF on discharge to improve pt safety and independence during ADLs and to improve strength in BUE required for ADL completion.     Follow Up Recommendations  SNF    Equipment Recommendations  None recommended by OT       Precautions / Restrictions Precautions Precautions: Fall Restrictions Weight Bearing Restrictions: No      Mobility Bed Mobility Overal bed mobility: Needs Assistance Bed Mobility: Supine to Sit;Sit to Supine     Supine to sit: Supervision Sit to supine: Min assist   General bed mobility comments: Significantly increased time for coming to EOB.   Transfers Overall transfer level: Needs  assistance Equipment used: Rolling walker (2 wheeled) Transfers: Sit to/from [employer] Sit to Stand: Min assist Stand pivot transfers: Mod assist       General transfer comment: Increased time, verbal cuing for sequencing RW and foot placement        ADL either performed or assessed with clinical judgement   ADL Overall ADL's : Needs assistance/impaired     Grooming: Set up;Sitting Grooming Details (indicate cue type and reason): Pt unable to stand for ADLs, requires set-up for seated tasks. Requiring increased time for task completion             Lower Body Dressing: Moderate assistance;Sitting/lateral leans Lower Body Dressing Details (indicate cue type and reason): Pt attempting to adjust socks, unable to successfully adjust therefore OT providing assist.  Toilet Transfer: Moderate assistance;Stand-pivot;RW Toilet Transfer Details (indicate cue type and reason): simulated with bed to chair transfer           General ADL Comments: Pt requiring increased time for task completion, verbal cuing for sequencing     Vision Patient Visual Report: No change from baseline Additional Comments: pt unable to verbalized changes in vision or follow standard testing due to cognition/            Pertinent Vitals/Pain Pain Assessment: No/denies pain     Hand Dominance Right   Extremity/Trunk Assessment Upper Extremity Assessment Upper Extremity Assessment: Generalized weakness(BUE grossly 3/5)   Lower Extremity Assessment Lower Extremity Assessment: Defer to PT evaluation   Cervical / Trunk Assessment Cervical / Trunk Assessment: Kyphotic   Communication Communication Communication: No difficulties  Cognition Arousal/Alertness: Awake/alert Behavior During Therapy: WFL for tasks assessed/performed Overall Cognitive Status: History of cognitive impairments - at baseline                                 General Comments: occasional  confusion, Short term memory deficits              Home Living Family/patient expects to be discharged to:: Skilled nursing facility Living Arrangements: Other relatives Available Help at Discharge: Family;Available PRN/intermittently Type of Home: Mobile home Home Access: Stairs to enter Entrance Stairs-Number of Steps: 8(unsure of accuracy) Entrance Stairs-Rails: Right;Left Home Layout: One level     Bathroom Shower/Tub: Occupational psychologist: Standard     Home Equipment: Environmental consultant - 2 wheels;Cane - single point   Additional Comments: Pt with short term memory deficits and accuracy of information is questionable.       Prior Functioning/Environment Level of Independence: Needs assistance  Gait / Transfers Assistance Needed: Uses RW for functional mobility ADL's / Homemaking Assistance Needed: Reports her sister comes and helps her with ADLs a few times a week            OT Problem List: Decreased strength;Decreased activity tolerance;Impaired balance (sitting and/or standing);Decreased cognition;Decreased safety awareness;Decreased knowledge of use of DME or AE;Impaired UE functional use      OT Treatment/Interventions: Self-care/ADL training;Therapeutic exercise;Neuromuscular education;Therapeutic activities;Patient/family education    OT Goals(Current goals can be found in the care plan section) Acute Rehab OT Goals Patient Stated Goal: To get better and go home OT Goal Formulation: With patient Time For Goal Achievement: 02/23/19 Potential to Achieve Goals: Good  OT Frequency: Min 2X/week   Barriers to D/C: Decreased caregiver support             AM-PAC OT "6 Clicks" Daily Activity     Outcome Measure Help from another person eating meals?: A Little Help from another person taking care of personal grooming?: A Lot Help from another person toileting, which includes using toliet, bedpan, or urinal?: A Lot Help from another person bathing  (including washing, rinsing, drying)?: A Lot Help from another person to put on and taking off regular upper body clothing?: A Lot Help from another person to put on and taking off regular lower body clothing?: A Lot 6 Click Score: 13   End of Session Equipment Utilized During Treatment: Gait belt;Rolling walker Nurse Communication: Mobility status  Activity Tolerance: Patient tolerated treatment well Patient left: in bed;with call bell/phone within reach;with nursing/sitter in room  OT Visit Diagnosis: Muscle weakness (generalized) (M62.81);Repeated falls (R29.6)                Time: 6222-9798 OT Time Calculation (min): 31 min Charges:  OT General Charges $OT Visit: 1 Visit OT Evaluation $OT Eval Low Complexity: [place], OTR/L  [phone] 02/09/2019, 10:01 AM
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
## 2019-02-09 NOTE — Evaluation (Addendum)
Physical Therapy Evaluation Patient Details Name: Christy Joseph MRN: [id number] DOB: [birth year] Today's Date: 02/09/2019   History of Present Illness  Christy Joseph is a 58 y.o. female with medical history significant of multiple sclerosis, CVA, seizure disorder, HTN, GERD, tobacco abuse who presents to ED with progressive weakness, confusion, and fall.  Patient with baseline confusion that has progressed over the past few weeks, much of the history is obtained from her caregiver who is at bedside.  Caregiver reports that patient fell out of bed this morning with a subsequent fall later this afternoon.  She has noticed that she has been more confused over the last 2 days with increased weakness and bilateral leg pain.  Caregiver also reports that she has been losing a fairly significant amount of weight over the past several weeks to months with decreased oral appetite and concerns about her swallowing ability especially with her home medications.     Clinical Impression  Patient requires much time with slow labored movement to sit up at bedside, has difficulty using BUE to scoot self forward due to weakness and trembling of extremities.  Patient unable to maintain standing balance without AD, required use of RW, ambulated in hallway requiring verbal/tactile cueing to avoid loss of balance.  Patient tolerated sitting up in chair after therapy - NT aware.  Patient will benefit from continued physical therapy in hospital and recommended venue below to increase strength, balance, endurance for safe ADLs and gait.  Orthostatic BP's as follows: supine 177/118, sitting 189/128, standing 167/132    Follow Up Recommendations SNF;Supervision - Intermittent;Supervision for mobility/OOB    Equipment Recommendations  None recommended by PT    Recommendations for Other Services       Precautions / Restrictions Precautions Precautions: Fall Restrictions Weight Bearing Restrictions: No      Mobility   Bed Mobility Overal bed mobility: Needs Assistance Bed Mobility: Supine to Sit     Supine to sit: Min guard;Min assist Sit to supine: Min assist   General bed mobility comments: requires much time for sitting up at bedside, has diffiuclty scooting self forward  Transfers Overall transfer level: Needs assistance Equipment used: Rolling walker (2 wheeled) Transfers: Sit to/from [facility] Sit to Stand: Min assist Stand pivot transfers: Min assist       General transfer comment: unsteady on feet with trembling of extremities  Ambulation/Gait Ambulation/Gait assistance: Min assist Gait Distance (Feet): 50 Feet Assistive device: Rolling walker (2 wheeled) Gait Pattern/deviations: Decreased step length - right;Decreased step length - left;Decreased stride length Gait velocity: decreased   General Gait Details: slow labored unsteady cadence with shakiness of extremities, no loss of balance, limited secondary to fatigue  Stairs            Wheelchair Mobility    Modified Rankin (Stroke Patients Only)       Balance Overall balance assessment: Needs assistance Sitting-balance support: Feet supported;No upper extremity supported Sitting balance-Leahy Scale: Fair     Standing balance support: During functional activity;No upper extremity supported Standing balance-Leahy Scale: Poor Standing balance comment: fair using RW                             Pertinent Vitals/Pain Pain Assessment: No/denies pain    Home Living Family/patient expects to be discharged to:: Private residence Living Arrangements: Spouse/significant other(per patient) Available Help at Discharge: Family;Available PRN/intermittently Type of Home: Mobile home Home Access: Stairs to enter Entrance  Stairs-Rails: Right;Left;Can reach both Entrance Stairs-Number of Steps: 4 Home Layout: One level Home Equipment: Walker - 2 wheels;Cane - single point;Wheelchair -  manual Additional Comments: Pt with short term memory deficits and accuracy of information is questionable.     Prior Function Level of Independence: Needs assistance   Gait / Transfers Assistance Needed: household ambulator with SPC, "per patient"  ADL's / Homemaking Assistance Needed: states that her spouse assist        Hand Dominance   Dominant Hand: Right    Extremity/Trunk Assessment   Upper Extremity Assessment Upper Extremity Assessment: Defer to OT evaluation    Lower Extremity Assessment Lower Extremity Assessment: Generalized weakness    Cervical / Trunk Assessment Cervical / Trunk Assessment: Kyphotic  Communication   Communication: No difficulties  Cognition Arousal/Alertness: Awake/alert Behavior During Therapy: WFL for tasks assessed/performed Overall Cognitive Status: History of cognitive impairments - at baseline                                 General Comments: occasional confusion, Short term memory deficits      General Comments      Exercises     Assessment/Plan    PT Assessment Patient needs continued PT services  PT Problem List Decreased strength;Decreased activity tolerance;Decreased balance;Decreased mobility       PT Treatment Interventions Gait training;Stair training;Functional mobility training;Therapeutic activities;Therapeutic exercise;Patient/family education;Balance training    PT Goals (Current goals can be found in the Care Plan section)  Acute Rehab PT Goals Patient Stated Goal: To get better and go home PT Goal Formulation: With patient Time For Goal Achievement: 02/23/19 Potential to Achieve Goals: Good    Frequency Min 3X/week   Barriers to discharge        Co-evaluation               AM-PAC PT "6 Clicks" Mobility  Outcome Measure Help needed turning from your back to your side while in a flat bed without using bedrails?: A Little Help needed moving from lying on your back to sitting on  the side of a flat bed without using bedrails?: A Little Help needed moving to and from a bed to a chair (including a wheelchair)?: A Little Help needed standing up from a chair using your arms (e.g., wheelchair or bedside chair)?: A Little Help needed to walk in hospital room?: A Little Help needed climbing 3-5 steps with a railing? : A Lot 6 Click Score: 17    End of Session   Activity Tolerance: Patient tolerated treatment well;Patient limited by fatigue Patient left: in chair;with call bell/phone within reach;with chair alarm set Nurse Communication: Mobility status PT Visit Diagnosis: Unsteadiness on feet (R26.81);Other abnormalities of gait and mobility (R26.89);Muscle weakness (generalized) (M62.81)    Time: [id number] PT Time Calculation (min) (ACUTE ONLY): 32 min   Charges:   PT Evaluation $PT Eval Moderate Complexity: 1 Mod PT Treatments $Therapeutic Activity: 23-37 mins        12:06 PM, 02/09/19 Ocie BobJames Makenli Derstine, MPT Physical Therapist with [facility] 336 [phone] office [phone] mobile phone

## 2019-02-09 NOTE — Progress Notes (Signed)
Initial Nutrition Assessment  DOCUMENTATION CODES:   Severe malnutrition in context of chronic illness  INTERVENTION:  -Recommend SLP evaluation related to swallowing difficulties   NUTRITION DIAGNOSIS:   Severe Malnutrition related to chronic illness as evidenced by severe fat depletion, severe muscle depletion, moderate fat depletion.  GOAL:   Patient will meet greater than or equal to 90% of their needs   MONITOR:   Labs, I & O's, PO intake, Supplement acceptance, Weight trends  REASON FOR ASSESSMENT:   Consult Assessment of nutrition requirement/status  ASSESSMENT:  58 year old female with medical history significant of MS, CVA, seizure disorder, HTN, GERD, HLD, bowel/urine incontinence who presented to ED with progressive weakness, confusion, and reported multiple falls. Patient with baseline confusion that has progressed over the past few weeks; significant wt loss over the past several weeks to months with decreased appetite and swallowing difficulty; history obtained from caregiver  Patient admitted with hypertensive emergency; observation  Patient documented with 25% of breakfast this morning. Patient is a poor historian; unable to obtain nutrition history at this time.   Patient sitting in bed side chair at time of visit. Patient mouth appeared full as RD entered room; pt mumbled that she needed something to spit in. RD provided patient with clean, large bed pad that was on the counter. Patient proceeded to spit out approximately 1/4 cup of thick pale brown/pink fluid into pad. RD pressed patient call bell to notify RN.  NT arrived in room who reported patient ate pancakes with syrup for breakfast; denied swallowing difficulties with meal. NT endorses patient had difficulty with swallowing fluids; reporting patient held coffee in her mouth for an extended amount of time before swallowing. NT to notify RN of pt episode of emesis texture and color. Recommend SLP evaluation;  assistance with feeding at meal times.   Current wt 52.2 kg (114.8 lb)  Medications reviewed and include: depakote, pepcid, gabapentin, B12  Labs: hypokalemia - resolved   NUTRITION - FOCUSED PHYSICAL EXAM:    Most Recent Value  Orbital Region  Moderate depletion  Upper Arm Region  Severe depletion  Thoracic and Lumbar Region  Severe depletion  Buccal Region  Moderate depletion  Temple Region  Moderate depletion  Clavicle Bone Region  Severe depletion  Clavicle and Acromion Bone Region  Severe depletion  Scapular Bone Region  Unable to assess  Dorsal Hand  Severe depletion  Patellar Region  Severe depletion  Anterior Thigh Region  Severe depletion  Posterior Calf Region  Severe depletion  Edema (RD Assessment)  None  Hair  Reviewed  Eyes  Reviewed  Mouth  Reviewed  Skin  Reviewed  Nails  Reviewed      Diet Order:   Diet Order            Diet Heart Room service appropriate? Yes; Fluid consistency: Thin  Diet effective now              EDUCATION NEEDS:   Not appropriate for education at this time  Skin:  Skin Assessment: Reviewed RN Assessment  Last BM:  PTA  Height:   Ht Readings from Last 1 Encounters:  02/08/19 5\' 4"  (1.626 m)    Weight:   Wt Readings from Last 1 Encounters:  02/08/19 52.2 kg    Ideal Body Weight:  54.5 kg  BMI:  Body mass index is 19.74 kg/m.  Estimated Nutritional Needs:   Kcal:  1675-1825 (32-35 kcal/kg)  Protein:  81-91  Fluid:  >1.6L  Lars MassonSuzanne Veasna Santibanez, RD, LDN Office Telephone [phone] After Hours/Weekend Pager: [phone]

## 2019-02-09 NOTE — Plan of Care (Signed)
  Problem: Acute Rehab OT Goals (only OT should resolve) Goal: Pt. Will Perform Eating Flowsheets (Taken 02/09/2019 1004) Pt Will Perform Eating:  with modified independence  sitting Goal: Pt. Will Perform Grooming Flowsheets (Taken 02/09/2019 1004) Pt Will Perform Grooming:  with set-up  sitting Goal: Pt. Will Perform Lower Body Dressing Flowsheets (Taken 02/09/2019 1004) Pt Will Perform Lower Body Dressing:  with min assist  sitting/lateral leans  sit to/from stand Goal: Pt. Will Transfer To Toilet Flowsheets (Taken 02/09/2019 1004) Pt Will Transfer to Toilet:  with min guard assist  ambulating  regular height toilet  bedside commode Goal: Pt. Will Perform Toileting-Clothing Manipulation Flowsheets (Taken 02/09/2019 1004) Pt Will Perform Toileting - Clothing Manipulation and hygiene:  with min guard assist  sitting/lateral leans  sit to/from stand Goal: Pt/Caregiver Will Perform Home Exercise Program Flowsheets (Taken 02/09/2019 1004) Pt/caregiver will Perform Home Exercise Program:  Increased strength  Both right and left upper extremity  With Supervision  With written HEP provided

## 2019-02-09 NOTE — Consult Note (Signed)
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 and upper cervical spine:  Grossly negative visible cervical spine and spinal cord. Visualized bone marrow signal is within normal limits.  Sinuses/Orbits: Postoperative changes to both globes. Possible asymmetric T2 hyperintensity of the right optic nerve. Paranasal sinuses are clear.  Other: Mastoids are clear. Visible internal auditory structures appear normal. Scalp and face soft tissues appear negative.  IMPRESSION: 1. Confluent and severe signal changes in the brain - including the brainstem - are nonspecific but  compatible with advanced chronic demyelinating disease. No evidence of active demyelination. 2. Abnormal distal left vertebral artery with poor flow or less likely occlusion. No evidence of acute infarct. CTA or MRA could evaluate further.      The brain MRI is reviewed in person.  No acute findings are noted on DWI.  There is significant global atrophy for her age.  There is severe confluent leukoencephalopathy consistent with a diagnosis of multiple sclerosis but also consistent with severe microvascular changes.  There are multiple areas of reduced signal involving the deep white matter and deep nuclei which could be either due to black holes from MS or in the case of the nuclide lacunar infarcts.  No abnormal contrast enhancement is appreciated.  No evidence of venous thrombosis.  There appears to be some evidence of microhemorrhage involving the cerebellum bilaterally.  These are also associated with reduced signal.      Christy Laba A. Gerilyn Pilgrimoonquah, M.D.  Diplomate, Biomedical engineerAmerican Board of Psychiatry and Neurology ( Neurology). 02/09/2019, 4:48 PM

## 2019-02-09 NOTE — TOC Initial Note (Signed)
Transition of Care [facility](TOC) - Initial/Assessment Note    Patient Details  Name: Christy Joseph MRN: [id number] Date of Birth: 12/05/1960  Transition of Care [facility](TOC) CM/SW Contact:    Kymari Nuon, Chrystine OilerSharley Diane, RN Phone Number: 02/09/2019, 2:29 PM  Clinical Narrative:   Hypertensive urgency. From home, active with [facility], has 3 hours a day 4 days a week provided by [facility].  Family pays OOP for an extra 2 hours a day M-F.    Recommended for SNF vs supervision. Call to husband, no answer. Call to sister, really bad connection as she was at work. [facility]'s SW, she reports she feels family may be interested. She plans to follow up with family and will call TOC tomorrow. [facility] [place] [place] [facility] by [employer].             Expected Discharge Plan: Skilled Nursing Facility Barriers to Discharge: No Barriers Identified     Expected Discharge Plan and Services Expected Discharge Plan: Skilled Nursing Facility       Living arrangements for the past 2 months: Single Family Home                     Prior Living Arrangements/Services Living arrangements for the past 2 months: Single Family Home Lives with:: Spouse Patient language and need for interpreter reviewed:: Yes        Need for Family Participation in Patient Care: Yes (Comment) Care giver support system in place?: Yes (comment) Current home services: DME(RW) Criminal Activity/Legal Involvement Pertinent to Current Situation/Hospitalization: No - Comment as needed  Activities of Daily Living Home Assistive Devices/Equipment: Walker (specify type) ADL Screening (condition at time of admission) Patient's cognitive ability adequate to safely complete daily activities?: No Is the patient deaf or have difficulty hearing?: No Does the patient have difficulty seeing, even when wearing glasses/contacts?: No Does the patient have difficulty concentrating, remembering, or making  decisions?: Yes Patient able to express need for assistance with ADLs?: Yes Does the patient have difficulty dressing or bathing?: Yes Independently performs ADLs?: No Communication: Independent Dressing (OT): Needs assistance Is this a change from baseline?: Pre-admission baseline Grooming: Needs assistance Is this a change from baseline?: Pre-admission baseline Feeding: Independent Bathing: Needs assistance Is this a change from baseline?: Pre-admission baseline Toileting: Needs assistance Is this a change from baseline?: Pre-admission baseline In/Out Bed: Needs assistance Is this a change from baseline?: Pre-admission baseline Walks in Home: Needs assistance Is this a change from baseline?: Pre-admission baseline Does the patient have difficulty walking or climbing stairs?: Yes Weakness of Legs: Both Weakness of Arms/Hands: None         Orientation: : Fluctuating Orientation (Suspected and/or reported Sundowners) Alcohol / Substance Use: Other (comment)(THC) Psych Involvement: No (comment)  Admission diagnosis:  Hypertensive urgency [I16.0] Acute pain of left knee [M25.562] Patient Active Problem List   Diagnosis Date Noted  . CVA (cerebral vascular accident) (HCC) 02/09/2019  . Hypertensive urgency 02/08/2019  . Gait disturbance 08/08/2018  . Cognitive deficit secondary to multiple sclerosis (HCC) 08/08/2018  . Closed compression fracture of L3 lumbar vertebra 06/25/2016  . Essential hypertension, benign 10/10/2015  . Hyperlipidemia LDL goal <130 10/10/2015  . Anxiety and depression 10/10/2015  . Seizure (HCC) 10/10/2015  . Multiple sclerosis (HCC) 10/10/2015  . H/O: stroke 10/10/2015  . Osteoporosis 10/10/2015   PCP:  [facility]   [id number] - [place], [place] - [address] [place] [address] Phone: [phone] Fax: [phone]  [place], [place] [address] [place] [address] [place] [address] Phone: [phone] Fax: [phone]     Social Determinants of Health (SDOH) Interventions    Readmission Risk Interventions No flowsheet data found.

## 2019-02-10 LAB — BASIC METABOLIC PANEL
Anion gap: 13 (ref 5–15)
BUN: 22 mg/dL — ABNORMAL HIGH (ref 6–20)
CO2: 23 mmol/L (ref 22–32)
Calcium: 9.2 mg/dL (ref 8.9–10.3)
Chloride: 104 mmol/L (ref 98–111)
Creatinine, Ser: 1.12 mg/dL — ABNORMAL HIGH (ref 0.44–1.00)
GFR calc Af Amer: >60 mL/min (ref >60)
GFR calc non Af Amer: 54 mL/min — ABNORMAL LOW (ref >60)
Glucose, Bld: 128 mg/dL — ABNORMAL HIGH (ref 70–99)
Potassium: 3.7 mmol/L (ref 3.5–5.1)
Sodium: 140 mmol/L (ref 135–145)

## 2019-02-10 LAB — RPR: RPR Ser Ql: NONREACTIVE

## 2019-02-10 LAB — HIV ANTIBODY (ROUTINE TESTING W REFLEX): HIV Screen 4th Generation wRfx: NONREACTIVE

## 2019-02-10 MED ORDER — LOSARTAN POTASSIUM 50 MG PO TABS
100.0000 mg | ORAL_TABLET | Freq: Every day | ORAL | Status: DC
  Administered 2019-02-11 – 2019-02-16 (×6): 100 mg via ORAL
  Filled 2019-02-10 (×7): qty 2

## 2019-02-10 NOTE — NC FL2 (Signed)
MEDICAID FL2 LEVEL OF CARE SCREENING TOOL     IDENTIFICATION  Patient Name: Christy Joseph Birthdate: 07-27-1960 Sex: female Admission Date (Current Location): 02/08/2019  [facility] and [place] Number:  [employer] and Address:  [facility] [address], [place]      Provider Number: [id number]  Attending Physician Name and Address:  Rodena Goldmann, DO  Relative Name and Phone Number:  Lynnda Shields  [phone]    Current Level of Care: Hospital Recommended Level of Care: [place] Prior Approval Number:    Date Approved/Denied:   PASRR Number: [id number]  Discharge Plan: SNF    Current Diagnoses: Patient Active Problem List   Diagnosis Date Noted  . CVA (cerebral vascular accident) ([place]) 02/09/2019  . Hypertensive urgency 02/08/2019  . Gait disturbance 08/08/2018  . 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 Closed compression fracture of L3 lumbar vertebra 06/25/2016  . Essential hypertension, benign 10/10/2015  . Hyperlipidemia LDL goal <130 10/10/2015  . Anxiety and depression 10/10/2015  . Seizure ([place]) 10/10/2015  . Multiple sclerosis ([place]) 10/10/2015  . H/O: stroke 10/10/2015  . Osteoporosis 10/10/2015    Orientation RESPIRATION BLADDER Height & Weight     Self  Normal Incontinent Weight: 52.2 kg Height:  5\' 4"  (162.6 cm)  BEHAVIORAL SYMPTOMS/MOOD NEUROLOGICAL BOWEL NUTRITION STATUS      Incontinent Diet  AMBULATORY STATUS COMMUNICATION OF NEEDS Skin   Extensive Assist Verbally Normal                       Personal Care Assistance Level of Assistance  Bathing, Feeding, Dressing Bathing Assistance: Maximum assistance Feeding assistance: Limited assistance Dressing Assistance: Maximum assistance     Functional Limitations Info  Sight, Hearing, Speech Sight Info: Impaired Hearing Info: Adequate Speech Info: Adequate    SPECIAL CARE FACTORS  FREQUENCY  PT (By licensed PT)     PT Frequency: 5 Times a week              Contractures Contractures Info: Not present    Additional Factors Info  Code Status, Allergies Code Status Info: Full Allergies Info: NKDA           Current Medications (02/10/2019):  This is the current hospital active medication list Current Facility-Administered Medications  Medication Dose Route Frequency Provider Last Rate Last Dose  . acetaminophen (TYLENOL) tablet 650 mg  650 mg Oral Q6H PRN [place] Donnamarie Poag, DO       Or  . acetaminophen (TYLENOL) suppository 650 mg  650 mg Rectal Q6H PRN [place] Eric J, DO      . amLODipine (NORVASC) tablet 10 mg  10 mg Oral Daily Manuella Ghazi, Pratik D, DO   10 mg at 02/10/19 1005  . aspirin tablet 650 mg  650 mg Oral Daily [place] Eric J, DO   650 mg at 02/10/19 1006  . buPROPion [facility]) tablet 100 mg  100 mg Oral Daily [place] Donnamarie Poag, DO   100 mg at 02/10/19 1006  . divalproex (DEPAKOTE ER) 24 hr tablet 500 mg  500 mg Oral Daily [place] Donnamarie Poag, DO   500 mg at 02/10/19 1010  . enoxaparin (LOVENOX) injection 40 mg  40 mg Subcutaneous Q24H [place] Eric J, DO   40 mg at 02/09/19 2234  . famotidine (PEPCID) tablet 20 mg  20 mg Oral Daily [place] Donnamarie Poag, DO   20 mg at 02/10/19 1006  .  gabapentin (NEURONTIN) capsule 300 mg  300 mg Oral Daily [place], Alvira Philips, DO   300 mg at 02/10/19 1005  . hydrALAZINE (APRESOLINE) injection 10 mg  10 mg Intravenous Q4H PRN [place], Eric J, DO   10 mg at 02/10/19 0700  . [START ON 02/11/2019] losartan (COZAAR) tablet 100 mg  100 mg Oral Daily Shah, Pratik D, DO      . methylPREDNISolone sodium succinate (SOLU-MEDROL) 1,000 mg in sodium chloride 0.9 % 50 mL IVPB  1,000 mg Intravenous Q24 Hr x 5 [place], Kofi, MD 58 mL/hr at 02/10/19 0200 1,000 mg at 02/10/19 0200  . nicotine (NICODERM CQ - dosed in mg/24 hours) patch 21 mg  21 mg Transdermal Daily [place], Alvira Philips, DO   21 mg at 02/10/19 1007  . ondansetron (ZOFRAN) tablet 4 mg  4 mg  Oral Q6H PRN [place], Alvira Philips, DO       Or  . ondansetron [facility]) injection 4 mg  4 mg Intravenous Q6H PRN [place], Eric J, DO      . polyethylene glycol (MIRALAX / GLYCOLAX) packet 17 g  17 g Oral Daily PRN [place], Eric J, DO      . rosuvastatin (CRESTOR) tablet 5 mg  5 mg Oral q1800 [place], Alvira Philips, DO   5 mg at 02/09/19 1752  . senna-docusate (Senokot-S) tablet 2 tablet  2 tablet Oral QHS PRN [place], Alvira Philips, DO      . sertraline (ZOLOFT) tablet 150 mg  150 mg Oral Daily [place], Eric J, DO   150 mg at 02/10/19 1005  . thiamine (B-1) injection 100 mg  100 mg Intravenous Daily Beryle Beams, MD   100 mg at 02/10/19 1007  . vitamin B-12 (CYANOCOBALAMIN) tablet 1,000 mcg  1,000 mcg Oral Daily [place], Alvira Philips, DO   1,000 mcg at 02/10/19 1005     Discharge Medications: Please see discharge summary for a list of discharge medications.  Relevant Imaging Results:  Relevant Lab Results:   Additional Information SS#[id number]  Leitha Bleak, RN

## 2019-02-10 NOTE — Progress Notes (Signed)
PROGRESS NOTE    Haynes HoehnVickie B Chapdelaine  AOZ:[id number] DOB: 09/21/1960 DOA: 02/08/2019 [facility]   Brief Narrative:  Per HPI: Christy FredricksonVickie B Adamsis a 58 y.o.femalewith medical history significant ofmultiple sclerosis, CVA, seizure disorder, HTN, GERD, tobacco abuse who presents to ED with progressive weakness, confusion, and fall. Patient with baseline confusion that has progressed over the past few weeks, much of the history is obtained from her caregiver who is at bedside. Caregiver reports that patient fell out of bed this morning with a subsequent fall later this afternoon. She has noticed that she has been more confused over the last 2 days with increased weakness and bilateral leg pain. Caregiver also reports that she has been losing a fairly significant amount of weight over the past several weeks to months with decreased oral appetite and concerns about her swallowing ability especially with her home medications.   Patient [facility] with recent reduction in her leflunomide to 10 mg p.o. daily for GI side effects and her Keppra was changed to Depakote 500 mg p.o. daily which was on 01/30/2019.  ED provider reported that she has not been taking her medications in a few days, although her caregiver did state that she did today. Unclear whether she took her medications today, but suspect she has not given her severe elevated blood pressure.  9/10: Patient seen and evaluated and continues to have elevated blood pressure readings as well as some confusion.  After discussion with the husband and caretaker today, it appears that there may be a combination of medication noncompliance, ongoing marijuana use, as well as recent medication changes by the neurologist that may have contributed to her symptoms.  PT has evaluated the patient with recommendations for SNF on discharge.   Assessment &  Plan:   Principal Problem:   Hypertensive urgency Active Problems:   Essential hypertension, benign   Hyperlipidemia LDL goal <130   Anxiety and depression   Seizure (HCC)   Multiple sclerosis (HCC)   Gait disturbance   Cognitive deficit secondary to multiple sclerosis (HCC)   CVA (cerebral vascular accident) (HCC)   Hypertensive crisis-improving -Possibly secondary to medication noncompliance as well as MS medications --Restart home losartan 75 mg p.o. daily --Adjust amlodipine to 10 mg p.o. daily --Hydralazine 10 mg IV every 4 hoursprn --Continue monitor blood pressure closely and adjust accordingly  Chest pain-resolved likely secondary to above, resolved --Troponin trend flat --Continue to monitor on telemetry --LVEF greater than 65% with no wall motion abnormalities noted --Continue aspirin and statin, currently not on a beta-blocker --Lipid panel with LDL 52 --morphine IV prn, nitro prn  Acute encephalopathy likely multifactorial secondary to hypertension, multiple sclerosis, dehydration, and thiamine deficiency with likely significant cognitive impairment at baseline and gait impairment - Urinalysis unrevealing. Electrolytes stable. --CT head and brain MRI with no acute findings --UDS positive for marijuana use -Treat MS flare as noted as well as supplement thiamine and monitor blood pressures --Depakote level is subtherapeutic at 14 with Depakote increased and Keppra withheld, continue gabapentin --Supportive care -PT recommending SNF on discharge which is appropriate  Hypokalemia-resolved -Recheck in a.m.  History of multiple sclerosis Diagnosed initially in 2012 following presentation with decreased balance and gait. Now with asymmetric weakness and falls. Recent changes in her medications with a decrease of leflunomide to 10 mg p.o. daily on 01/30/2019. Patient utilizes a walker at baseline. --Continue leflunomide at 10 mg p.o. daily --  MRI of brain  without active flare noted --Neurology consultation with methylprednisolone 1 g daily started for the next 3 to 5 days --PT/OT/speech therapy consultation recommending SNF on discharge  GERD --Continue Pepcid  Tobacco use disorder with ongoing marijuana use Patient continues to smoke roughly 1/2 packs/day. Counseled patient although, confused even at baseline. --Nicotine patch  Hx CVA Hx Seizure disorder Currently on Depakote 500 mg p.o. daily [facility]. --Continue Depakote 500 mg p.o. daily; appreciate neurology evaluation --Continue gabapentin --Continue aspirin and statin  Weight loss Caregiver reports poor appetite and significant weight loss over the past weeks to month. --Nutrition consult for evaluation -Likely related to marijuana use   DVT prophylaxis:Lovenox Code Status: Full Family Communication: Discussed on phone with husband and sister on 9/10. Disposition Plan:  Appreciate neurology evaluation with initiation of IV methylprednisolone to continue for the next 3 to 5 days as well as adjustments to MS medications.  Continue to monitor blood pressures.  PT recommending SNF on discharge.   Consultants:   Neurology  Procedures:   As noted below  Antimicrobials:   None  Subjective: Patient seen and evaluated today with no new acute complaints or concerns. No acute concerns or events noted overnight.  She appears to be more alert and oriented this morning.  Denies any aches or pains today.  Objective: Vitals:   02/09/19 1629 02/09/19 2000 02/09/19 2002 02/10/19 0512  BP: (!) 147/96 (!) 141/103 (!) 142/101 (!) 161/105  Pulse:  (!) 106 (!) 106 90  Resp:  18  16  Temp:  98.5 F (36.9 C)  98.2 F (36.8 C)  TempSrc:  Oral  Oral  SpO2:  94% 95% 92%  Weight:      Height:        Intake/Output Summary (Last 24 hours) at 02/10/2019 0924 Last data filed at 02/10/2019  0900 Gross per 24 hour  Intake 600 ml  Output 1050 ml  Net -450 ml   Filed Weights   02/08/19 1035  Weight: 52.2 kg    Examination:  General exam: Appears calm and comfortable  Respiratory system: Clear to auscultation. Respiratory effort normal. Cardiovascular system: S1 & S2 heard, RRR. No JVD, murmurs, rubs, gallops or clicks. No pedal edema. Gastrointestinal system: Abdomen is nondistended, soft and nontender. No organomegaly or masses felt. Normal bowel sounds heard. Central nervous system: Alert and awake Extremities: Symmetric 5 x 5 power. Skin: No rashes, lesions or ulcers Psychiatry: Flat affect    Data Reviewed: I have personally reviewed following labs and imaging studies  CBC: Recent Labs  Lab 02/08/19 1354 02/09/19 0706  WBC 6.5 7.8  NEUTROABS 4.0  --   HGB 12.7 14.7  HCT 40.5 45.9  MCV 97.4 95.2  PLT 208 302   Basic Metabolic Panel: Recent Labs  Lab 02/08/19 1354 02/09/19 0706 02/10/19 0653  NA 144 139 140  K 3.3* 3.6 3.7  CL 105 104 104  CO2 27 20* 23  GLUCOSE 87 114* 128*  BUN 12 16 22*  CREATININE 0.94 0.90 1.12*  CALCIUM 9.5 9.5 9.2  MG  --  1.9  --    GFR: Estimated Creatinine Clearance: 45.1 mL/min (A) (by C-G formula based on SCr of 1.12 mg/dL (H)). Liver Function Tests: No results for input(s): AST, ALT, ALKPHOS, BILITOT, PROT, ALBUMIN in the last 168 hours. No results for input(s): LIPASE, AMYLASE in the last 168 hours. No results for input(s): AMMONIA in the  last 168 hours. Coagulation Profile: No results for input(s): INR, PROTIME in the last 168 hours. Cardiac Enzymes: No results for input(s): CKTOTAL, CKMB, CKMBINDEX, TROPONINI in the last 168 hours. BNP (last 3 results) No results for input(s): PROBNP in the last 8760 hours. HbA1C: No results for input(s): HGBA1C in the last 72 hours. CBG: No results for input(s): GLUCAP in the last 168 hours. Lipid Profile: Recent Labs    02/08/19 1354  CHOL 134  HDL 66  LDLCALC  52  TRIG 78  CHOLHDL 2.0   Thyroid Function Tests: Recent Labs    02/08/19 1354  TSH 0.768   Anemia Panel: Recent Labs    02/08/19 1354  VITAMINB12 2,120*   Sepsis Labs: No results for input(s): PROCALCITON, LATICACIDVEN in the last 168 hours.  Recent Results (from the past 240 hour(s))  SARS Coronavirus 2 [facility] order, Performed in [facility] hospital lab) Nasopharyngeal Nasopharyngeal Swab     Status: None   Collection Time: 02/08/19  5:02 PM   Specimen: Nasopharyngeal Swab  Result Value Ref Range Status   SARS Coronavirus 2 NEGATIVE NEGATIVE Final    Comment: (NOTE) If result is NEGATIVE SARS-CoV-2 target nucleic acids are NOT DETECTED. The SARS-CoV-2 RNA is generally detectable in upper and lower  respiratory specimens during the acute phase of infection. The lowest  concentration of SARS-CoV-2 viral copies this assay can detect is 250  copies / mL. A negative result does not preclude SARS-CoV-2 infection  and should not be used as the sole basis for treatment or other  patient management decisions.  A negative result may occur with  improper specimen collection / handling, submission of specimen other  than nasopharyngeal swab, presence of viral mutation(s) within the  areas targeted by this assay, and inadequate number of viral copies  (<250 copies / mL). A negative result must be combined with clinical  observations, patient history, and epidemiological information. If result is POSITIVE SARS-CoV-2 target nucleic acids are DETECTED. The SARS-CoV-2 RNA is generally detectable in upper and lower  respiratory specimens dur ing the acute phase of infection.  Positive  results are indicative of active infection with SARS-CoV-2.  Clinical  correlation with patient history and other diagnostic information is  necessary to determine patient infection status.  Positive results do  not rule out bacterial infection or co-infection with other viruses. If result is  PRESUMPTIVE POSTIVE SARS-CoV-2 nucleic acids MAY BE PRESENT.   A presumptive positive result was obtained on the submitted specimen  and confirmed on repeat testing.  While 2019 novel coronavirus  (SARS-CoV-2) nucleic acids may be present in the submitted sample  additional confirmatory testing may be necessary for epidemiological  and / or clinical management purposes  to differentiate between  SARS-CoV-2 and other Sarbecovirus currently known to infect humans.  If clinically indicated additional testing with an alternate test  methodology [phone]) is advised. The SARS-CoV-2 RNA is generally  detectable in upper and lower respiratory sp ecimens during the acute  phase of infection. The expected result is Negative. Fact Sheet for Patients:  StrictlyIdeas.no Fact Sheet for Healthcare Providers: BankingDealers.co.za This test is not yet approved or cleared by the [place] FDA and has been authorized for detection and/or diagnosis of SARS-CoV-2 by FDA under an Emergency Use Authorization (EUA).  This EUA will remain in effect (meaning this test can be used) for the duration of the COVID-19 declaration under Section 564(b)(1) of the Act, 21 U.S.C. section 360bbb-3(b)(1), unless the authorization is terminated  or revoked sooner. Performed at [facility], [address], [place], [place] [address]          Radiology Studies: Ct Head Wo Contrast  Result Date: 02/08/2019 CLINICAL DATA:  Fall.  Altered level of consciousness. EXAM: CT HEAD WITHOUT CONTRAST TECHNIQUE: Contiguous axial images were obtained from the base of the skull through the vertex without intravenous contrast. COMPARISON:  01/08/2012 FINDINGS: Brain: Advanced atrophy and chronic small vessel disease throughout the deep white matter, significantly progressed since prior study. No acute intracranial abnormality. Specifically, no hemorrhage, hydrocephalus, mass lesion, acute  infarction, or significant intracranial injury. Vascular: No hyperdense vessel or unexpected calcification. Skull: No acute calvarial abnormality. Sinuses/Orbits: Visualized paranasal sinuses and mastoids clear. Orbital soft tissues unremarkable. Other: None IMPRESSION: Advanced atrophy and chronic small vessel disease. No acute intracranial abnormality Electronically Signed   By: Charlett Nose M.D.   On: 02/08/2019 19:29   Mr Laqueta Jean WL Contrast  Result Date: 02/09/2019 CLINICAL DATA:  58 year old female with recent fall and altered mental status. History of multiple sclerosis, query disease flare up. EXAM: MRI HEAD WITHOUT AND WITH CONTRAST TECHNIQUE: Multiplanar, multiecho pulse sequences of the brain and surrounding structures were obtained without and with intravenous contrast. CONTRAST:  78mL GADAVIST GADOBUTROL 1 MMOL/ML IV SOLN COMPARISON:  No prior MRI.  Head CTs 02/08/2019 and earlier. FINDINGS: Brain: No restricted diffusion or evidence of acute infarction. Confluent and severe abnormal T2 and FLAIR hyperintensity throughout much of the cerebral white matter, with severe atrophy of the corpus callosum (series 12, image 14). Some small areas of cortical involvement are noted (left frontal lobe series 9, image 38). Deep white matter capsule involvement, especially the external capsules. Multifocal T2 heterogeneity in the bilateral deep gray nuclei, a especially the medial thalami (series 7, image 12). Confluent and severe T2 and FLAIR hyperintensity throughout the pons and medulla (series 9, image 13 and series 12, image 14) where diffusion appears facilitated. No associated brainstem enlargement. Comparatively mild scattered T2 and FLAIR hyperintensity in the cerebellum. The T2 signal abnormality continues to the cervicomedullary junction. No abnormal enhancement identified.  No dural thickening. No midline shift, mass effect, evidence of mass lesion, ventriculomegaly, extra-axial collection or acute  intracranial hemorrhage. Pituitary within normal limits. Vascular: Loss of the distal left vertebral artery flow void in keeping with poor flow or thrombosis on series 7, image 3. There is some preserved enhancement of the distal left vertebral artery following contrast. The distal right vertebral artery and other Major intracranial vascular flow voids are preserved. The major dural venous sinuses are enhancing and appear to be patent. Skull and upper cervical spine: Grossly negative visible cervical spine and spinal cord. Visualized bone marrow signal is within normal limits. Sinuses/Orbits: Postoperative changes to both globes. Possible asymmetric T2 hyperintensity of the right optic nerve. Paranasal sinuses are clear. Other: Mastoids are clear. Visible internal auditory structures appear normal. Scalp and face soft tissues appear negative. IMPRESSION: 1. Confluent and severe signal changes in the brain - including the brainstem - are nonspecific but compatible with advanced chronic demyelinating disease. No evidence of active demyelination. 2. Abnormal distal left vertebral artery with poor flow or less likely occlusion. No evidence of acute infarct. CTA or MRA could evaluate further. Electronically Signed   By: Odessa Fleming M.D.   On: 02/09/2019 10:12   Dg Knee Left Port  Result Date: 02/08/2019 CLINICAL DATA:  Pain.  History of MS. EXAM: PORTABLE LEFT KNEE - 1-2 VIEW COMPARISON:  None. FINDINGS:  No evidence of fracture, dislocation, or joint effusion. No evidence of arthropathy or other focal bone abnormality. Soft tissues are unremarkable. IMPRESSION: Negative. Electronically Signed   By: Ted Mcalpine M.D.   On: 02/08/2019 11:45        Scheduled Meds:  amLODipine  10 mg Oral Daily   aspirin  650 mg Oral Daily   buPROPion  100 mg Oral Daily   divalproex  500 mg Oral Daily   enoxaparin (LOVENOX) injection  40 mg Subcutaneous Q24H   famotidine  20 mg Oral Daily   gabapentin  300 mg Oral  Daily   losartan  75 mg Oral Daily   nicotine  21 mg Transdermal Daily   rosuvastatin  5 mg Oral q1800   sertraline  150 mg Oral Daily   thiamine injection  100 mg Intravenous Daily   vitamin B-12  1,000 mcg Oral Daily   Continuous Infusions:  methylPREDNISolone (SOLU-MEDROL) injection 1,000 mg (02/10/19 0200)     LOS: 1 day    Time spent: 30 minutes    Marylen Zuk Hoover Brunette, DO [facility] Pager [phone]  If 7PM-7AM, please contact night-coverage [email or web address] Password [facility] 02/10/2019, 9:24 AM
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
## 2019-02-10 NOTE — TOC Progression Note (Signed)
Transition of Care [facility]) - Progression Note    Patient Details  Name: ISABELLY KOBLER MRN: [id number] Date of Birth: [birth year]  Transition of Care [facility]) CM/SW Contact  Boneta Lucks, RN Phone Number: 02/10/2019, 3:01 PM  Clinical Narrative:   Margit Banda with [facility] called to approve patient for SNF. They are requesting referrals to [place], [facility] and [facility].  They will provide transportation to facility. Spoke with Lynnda Shields- sister she would pick [facility] as first choice. CM called, Admission office already closed for the day. Per MD patient is not medically ready, Possible discharge will be Monday or Tuesday. TOC to follow.    Expected Discharge Plan: [place] Barriers to Discharge: No Barriers Identified  Expected Discharge Plan and Services Expected Discharge Plan: [place] arrangements for the past 2 months: [place]

## 2019-02-10 NOTE — Progress Notes (Signed)
Occupational Therapy Treatment Patient Details Name: Christy Joseph MRN: [id number] DOB: [birth year] Today's Date: 02/10/2019    History of present illness Christy Joseph is a 58 y.o. female with medical history significant of multiple sclerosis, CVA, seizure disorder, HTN, GERD, tobacco abuse who presents to ED with progressive weakness, confusion, and fall.  Patient with baseline confusion that has progressed over the past few weeks, much of the history is obtained from her caregiver who is at bedside.  Caregiver reports that patient fell out of bed this morning with a subsequent fall later this afternoon.  She has noticed that she has been more confused over the last 2 days with increased weakness and bilateral leg pain.  Caregiver also reports that she has been losing a fairly significant amount of weight over the past several weeks to months with decreased oral appetite and concerns about her swallowing ability especially with her home medications.    OT comments  Pt agreeable to OT session this am. Pt requiring increased time to sit at EOB as she is frequently distracted by bed linens, gown, heart monitor, etc. Consistent verbal cuing required throughout session for sequencing and follow through of tasks. Pt performing toileting tasks and toilet hygiene this am, requiring assist for clothing management due to balance deficits and inability to coordinate sequencing for managing gown and undergarments. Continued to require verbal cuing for hand washing at sink, min assist to maintain balance with 1 UE support. Continue to recommend SNF on discharge.    Follow Up Recommendations  SNF    Equipment Recommendations  None recommended by OT       Precautions / Restrictions Precautions Precautions: Fall       Mobility Bed Mobility Overal bed mobility: Needs Assistance Bed Mobility: Supine to Sit     Supine to sit: Supervision;Min guard     General bed mobility comments: requires much time  for sitting up at bedside, has diffiuclty scooting self forward  Transfers Overall transfer level: Needs assistance Equipment used: Rolling walker (2 wheeled) Transfers: Sit to/from [facility] Sit to Stand: Min assist Stand pivot transfers: Min assist       General transfer comment: cuing for sequencing        ADL either performed or assessed with clinical judgement   ADL Overall ADL's : Needs assistance/impaired     Grooming: Wash/dry hands;Minimal assistance;Standing Grooming Details (indicate cue type and reason): Pt requiring min assist to maintain standing for hand washing. Consistent verbal cuing for location soap, paper towels and for sequencing tasks                 Toilet Transfer: Minimal assistance;Ambulation;RW;Grab Information systems manager Details (indicate cue type and reason): cuing for use of grab bars and for decent onto commode Toileting- Clothing Manipulation and Hygiene: Maximal assistance;Sit to/from stand Toileting - Clothing Manipulation Details (indicate cue type and reason): Pt requiring max assist for doffing and donning underwear. Pt attempted however was unable to manage gown and maintain balance while attempting     Functional mobility during ADLs: Minimal assistance;Cueing for safety;Cueing for sequencing;Rolling walker General ADL Comments: Pt requiring consistent cuing for appropriate RW use and for bringing RLE forward during functional mobility               Cognition Arousal/Alertness: Awake/alert Behavior During Therapy: WFL for tasks assessed/performed Overall Cognitive Status: History of cognitive impairments - at baseline  Pertinent Vitals/ Pain       Pain Assessment: No/denies pain         Frequency  Min 2X/week        Progress Toward Goals  OT Goals(current goals can now be found in the care plan section)   Progress towards OT goals: Progressing toward goals  Acute Rehab OT Goals Patient Stated Goal: To get better and go home OT Goal Formulation: With patient Time For Goal Achievement: 02/23/19 Potential to Achieve Goals: Good ADL Goals Pt Will Perform Eating: with modified independence;sitting Pt Will Perform Grooming: with set-up;sitting Pt Will Perform Lower Body Dressing: with min assist;sitting/lateral leans;sit to/from stand Pt Will Transfer to Toilet: with min guard assist;ambulating;regular height toilet;bedside commode Pt Will Perform Toileting - Clothing Manipulation and hygiene: with min guard assist;sitting/lateral leans;sit to/from stand Pt/caregiver will Perform Home Exercise Program: Increased strength;Both right and left upper extremity;With Supervision;With written HEP provided  Plan Discharge plan remains appropriate          End of Session Equipment Utilized During Treatment: Gait belt;Rolling walker  OT Visit Diagnosis: Muscle weakness (generalized) (M62.81);Repeated falls (R29.6)   Activity Tolerance Patient tolerated treatment well   Patient Left in chair;with call bell/phone within reach;with chair alarm set   Nurse Communication Mobility status        Time: 0721-0800 OT Time Calculation (min): 39 min  Charges: OT General Charges $OT Visit: 1 Visit OT Treatments $Self Care/Home Management : 38-52 mins    Ezra SitesLeslie Joeline Freer, OTR/L  [phone] 02/10/2019, 8:10 AM

## 2019-02-10 NOTE — Progress Notes (Signed)
Physical Therapy Treatment Patient Details Name: Christy Joseph MRN: [id number] DOB: [birth year] Today's Date: 02/10/2019    History of Present Illness Christy Joseph is a 58 y.o. female with medical history significant of multiple sclerosis, CVA, seizure disorder, HTN, GERD, tobacco abuse who presents to ED with progressive weakness, confusion, and fall.  Patient with baseline confusion that has progressed over the past few weeks, much of the history is obtained from her caregiver who is at bedside.  Caregiver reports that patient fell out of bed this morning with a subsequent fall later this afternoon.  She has noticed that she has been more confused over the last 2 days with increased weakness and bilateral leg pain.  Caregiver also reports that she has been losing a fairly significant amount of weight over the past several weeks to months with decreased oral appetite and concerns about her swallowing ability especially with her home medications.     PT Comments    Patient agreeable for therapy and was up in chair earlier this AM per nursing staff.  Patient slow to respond to directions, requires much time with labored movement to sit up at bedside, demonstrates slow labored cadence requiring frequent verbal/tactile cueing to avoid drifting to the right with frequent bumping into near by objects and at high risk for falls.  Patient put back to bed after therapy requiring Mod assist to scoot up in bed.  Patient will benefit from continued physical therapy in hospital and recommended venue below to increase strength, balance, endurance for safe ADLs and gait.   Follow Up Recommendations  SNF;Supervision - Intermittent;Supervision for mobility/OOB     Equipment Recommendations       Recommendations for Other Services       Precautions / Restrictions Precautions Precautions: Fall Restrictions Weight Bearing Restrictions: No    Mobility  Bed Mobility Overal bed mobility: Needs  Assistance Bed Mobility: Supine to Sit;Sit to Supine     Supine to sit: Min assist Sit to supine: Min guard;Min assist   General bed mobility comments: slow labored movement, difficulty sitting up due to generalized weakness  Transfers Overall transfer level: Needs assistance Equipment used: Rolling walker (2 wheeled) Transfers: Sit to/from [facility] Sit to Stand: Min assist Stand pivot transfers: Min assist       General transfer comment: labored movement, frequent verbal/tactile cueing for safety/following directions  Ambulation/Gait Ambulation/Gait assistance: Min assist Gait Distance (Feet): 45 Feet Assistive device: Rolling walker (2 wheeled) Gait Pattern/deviations: Decreased step length - right;Decreased step length - left;Decreased stride length;Drifts right/left Gait velocity: decreased   General Gait Details: slow labored unsteady cadence with frequent drifting to the right, has difficulty making tursn using RW requiring assistance, limited secondary to fatigue   Stairs             Wheelchair Mobility    Modified Rankin (Stroke Patients Only)       Balance Overall balance assessment: Needs assistance Sitting-balance support: Feet supported;No upper extremity supported Sitting balance-Leahy Scale: Fair     Standing balance support: Bilateral upper extremity supported;During functional activity Standing balance-Leahy Scale: Poor Standing balance comment: fair using RW                            Cognition Arousal/Alertness: Awake/alert Behavior During Therapy: WFL for tasks assessed/performed Overall Cognitive Status: History of cognitive impairments - at baseline  Exercises      General Comments        Pertinent Vitals/Pain Pain Assessment: No/denies pain    Home Living                      Prior Function            PT Goals (current goals  can now be found in the care plan section) Acute Rehab PT Goals Patient Stated Goal: To get better and go home PT Goal Formulation: With patient Time For Goal Achievement: 02/23/19 Potential to Achieve Goals: Good Progress towards PT goals: Progressing toward goals    Frequency    Min 3X/week      PT Plan Current plan remains appropriate    Co-evaluation              AM-PAC PT "6 Clicks" Mobility   Outcome Measure  Help needed turning from your back to your side while in a flat bed without using bedrails?: A Little Help needed moving from lying on your back to sitting on the side of a flat bed without using bedrails?: A Lot Help needed moving to and from a bed to a chair (including a wheelchair)?: A Little Help needed standing up from a chair using your arms (e.g., wheelchair or bedside chair)?: A Little Help needed to walk in hospital room?: A Lot Help needed climbing 3-5 steps with a railing? : A Lot 6 Click Score: 15    End of Session Equipment Utilized During Treatment: Gait belt Activity Tolerance: Patient tolerated treatment well;Patient limited by fatigue Patient left: in bed;with call bell/phone within reach;with bed alarm set Nurse Communication: Mobility status PT Visit Diagnosis: Unsteadiness on feet (R26.81);Other abnormalities of gait and mobility (R26.89);Muscle weakness (generalized) (M62.81)     Time: 4193-7902 PT Time Calculation (min) (ACUTE ONLY): 20 min  Charges:  $Gait Training: 8-22 mins                     1:58 PM, 02/10/19 Ocie Bob, MPT Physical Therapist with [facility] 336 [phone] office [phone] mobile phone

## 2019-02-10 NOTE — Progress Notes (Signed)
  Speech Language Pathology Treatment: Dysphagia  Patient Details Name: Christy Joseph MRN: [id number] DOB: [birth year] Today's Date: 02/10/2019 Time: 9485-4627 SLP Time Calculation (min) (ACUTE ONLY): 21 min  Assessment / Plan / Recommendation Clinical Impression  Pt was provided ongoing diagnostic dysphagia therapy. Pt was alert and pleasant with seemingly continued improved mentation. Pt consumed regular textures with slow and uncoordinated oral stage with min oral residue after the swallow requiring sips to facilitate oral clearance. Pt consumed thin liquids with no overt s/sx of aspiration. Recommend continue with current diet with intermittent feeder assist with increased assist when/if pt exhibits AMS. ST will continue to follow while in acute setting to ensure diet tolerance   HPI HPI: Christy Joseph is a 58 y.o. female with medical history significant of multiple sclerosis, CVA, seizure disorder, HTN, GERD, tobacco abuse who presents to ED with progressive weakness, confusion, and fall.  Patient with baseline confusion that has progressed over the past few weeks, much of the history is obtained from her caregiver who is at bedside.  Caregiver reports that patient fell out of bed this morning with a subsequent fall later this afternoon.  She has noticed that she has been more confused over the last 2 days with increased weakness and bilateral leg pain.  Caregiver also reports that she has been losing a fairly significant amount of weight over the past several weeks to months with decreased oral appetite and concerns about her swallowing ability especially with her home medications.      SLP Plan  Continue with current plan of care       Recommendations  Diet recommendations: Regular;Thin liquid Liquids provided via: Cup;Straw Medication Administration: Whole meds with puree Supervision: Patient able to self feed;Intermittent supervision to cue for compensatory strategies Compensations:  Follow solids with liquid Postural Changes and/or Swallow Maneuvers: Seated upright 90 degrees;Upright 30-60 min after meal                Oral Care Recommendations: Oral care BID;Staff/trained caregiver to provide oral care Follow up Recommendations: None SLP Visit Diagnosis: Dysphagia, unspecified (R13.10) Plan: Continue with current plan of care       Gratia Disla H. Roddie Mc, CCC-SLP Speech Language Pathologist       Wende Bushy 02/10/2019, 11:19 AM

## 2019-02-11 LAB — BASIC METABOLIC PANEL
Anion gap: 10 (ref 5–15)
BUN: 27 mg/dL — ABNORMAL HIGH (ref 6–20)
CO2: 25 mmol/L (ref 22–32)
Calcium: 8.8 mg/dL — ABNORMAL LOW (ref 8.9–10.3)
Chloride: 106 mmol/L (ref 98–111)
Creatinine, Ser: 1.22 mg/dL — ABNORMAL HIGH (ref 0.44–1.00)
GFR calc Af Amer: 57 mL/min — ABNORMAL LOW (ref >60)
GFR calc non Af Amer: 49 mL/min — ABNORMAL LOW (ref >60)
Glucose, Bld: 132 mg/dL — ABNORMAL HIGH (ref 70–99)
Potassium: 3.4 mmol/L — ABNORMAL LOW (ref 3.5–5.1)
Sodium: 141 mmol/L (ref 135–145)

## 2019-02-11 LAB — CBC
HCT: 40.1 % (ref 36.0–46.0)
Hemoglobin: 12.3 g/dL (ref 12.0–15.0)
MCH: 29.5 pg (ref 26.0–34.0)
MCHC: 30.7 g/dL (ref 30.0–36.0)
MCV: 96.2 fL (ref 80.0–100.0)
Platelets: 300 10*3/uL (ref 150–400)
RBC: 4.17 MIL/uL (ref 3.87–5.11)
RDW: 15.4 % (ref 11.5–15.5)
WBC: 7.8 10*3/uL (ref 4.0–10.5)
nRBC: 0 % (ref 0.0–0.2)

## 2019-02-11 MED ORDER — VITAMIN B-1 100 MG PO TABS
100.0000 mg | ORAL_TABLET | Freq: Every day | ORAL | Status: DC
  Administered 2019-02-12 – 2019-02-16 (×5): 100 mg via ORAL
  Filled 2019-02-11 (×5): qty 1

## 2019-02-11 MED ORDER — POTASSIUM CHLORIDE CRYS ER 20 MEQ PO TBCR
40.0000 meq | EXTENDED_RELEASE_TABLET | Freq: Two times a day (BID) | ORAL | Status: AC
  Administered 2019-02-11 (×2): 40 meq via ORAL
  Filled 2019-02-11 (×2): qty 2

## 2019-02-11 MED ORDER — LORAZEPAM 2 MG/ML IJ SOLN
0.5000 mg | Freq: Once | INTRAMUSCULAR | Status: AC
  Administered 2019-02-11: 22:00:00 0.5 mg via INTRAVENOUS
  Filled 2019-02-11: qty 1

## 2019-02-11 NOTE — Progress Notes (Signed)
PROGRESS NOTE    [name]  VVO:[id number] DOB: May 10, 1961 DOA: 02/08/2019 PCP: [facility] [place]   Brief Narrative:  Per HPI: Christy Soler Adamsis a 58 y.o.femalewith medical history significant ofmultiple sclerosis, CVA, seizure disorder, HTN, GERD, tobacco abuse who presents to ED with progressive weakness, confusion, and fall. Patient with baseline confusion that has progressed over the past few weeks, much of the history is obtained from her caregiver who is at bedside. Caregiver reports that patient fell out of bed this morning with a subsequent fall later this afternoon. She has noticed that she has been more confused over the last 2 days with increased weakness and bilateral leg pain. Caregiver also reports that she has been losing a fairly significant amount of weight over the past several weeks to months with decreased oral appetite and concerns about her swallowing ability especially with her home medications.   Patient is followed for her underlying multiple sclerosis by [facility] with recent reduction in her leflunomide to 10 mg p.o. daily for GI side effects and her Keppra was changed to Depakote 500 mg p.o. daily which was on 01/30/2019.  ED provider reported that she has not been taking her medications in a few days, although her caregiver did state that she did today. Unclear whether she took her medications today, but suspect she has not given her severe elevated blood pressure.  9/11:Patient seen and evaluated and continues to have elevated blood pressure readings as well as some confusion. After discussion with the husband and caretaker today, it appears that there may be a combination of medication noncompliance, ongoing marijuana use, as well as recent medication changes by the neurologist that may have contributed to her symptoms. PT has evaluated the patient with recommendations for SNF on discharge.  9/12: Continue  current medications to include IV methylprednisolone as ordered by neurology.  She appears to be symptomatically improving with improved blood pressure control noted today.  Assessment & Plan:   Principal Problem:   Hypertensive urgency Active Problems:   Essential hypertension, benign   Hyperlipidemia LDL goal <130   Anxiety and depression   Seizure (HCC)   Multiple sclerosis (HCC)   Gait disturbance   Cognitive deficit secondary to multiple sclerosis (HCC)   CVA (cerebral vascular accident) ([place])   Hypertensivecrisis-improved -Possibly secondary to medication noncompliance as well as MS medications --Restart home losartan increased to 100 mg p.o. daily --Adjust amlodipine to 10 mg p.o. daily --Hydralazine 10 mg IV every 4 hoursprn --Continue monitor blood pressure closely and adjust accordingly  Chest pain-resolved likely secondary to above, resolved --Troponin trend flat --Continue to monitor on telemetry --LVEF greater than 65% with no wall motion abnormalities noted --Continue aspirin and statin, currently not on a beta-blocker --Lipid panel with LDL 52 --morphine IV prn, nitro prn  Acute encephalopathy likely multifactorial secondary to hypertension, multiple sclerosis, dehydration, and thiamine deficiency with likely significant cognitive impairment at baseline and gait impairment -Urinalysis unrevealing. Electrolytes stable. --CT headand brain MRI with no acute findings --UDS positive for marijuana use -Treat MS flare as noted as well as supplement thiamine and monitor blood pressures --Depakote level is subtherapeutic at 14 with Depakote increased and Keppra withheld, continue gabapentin --Supportive care -PT recommending SNF on discharge which is appropriate  Hypokalemia -Replete and reevaluate in a.m. along with magnesium  History of multiple sclerosis Diagnosed initially in 2012 following presentation with decreased balance and gait. Now with  asymmetric weakness and falls. Recent changes  in her medications with a decrease of leflunomide to 10 mg p.o. daily on 01/30/2019. Patient utilizes a walker at baseline. --Continue leflunomide at 10 mg p.o. daily --MRI of brain without active flare noted --Neurology consultation with methylprednisolone 1 g daily started for the next 3 to 5 days --PT/OT/speech therapy consultationrecommending SNF on discharge  GERD --Continue Pepcid  Tobacco use disorderwith ongoing marijuana use Patient continues to smoke roughly 1/2 packs/day. Counseled patient although, confused even at baseline. --Nicotine patch  Hx CVA Hx Seizure disorder Currently on Depakote 500 mg p.o. daily with recent change from [place] [facility] on 01/30/2019. --Continue Depakote 500 mg p.o. daily;appreciate neurology evaluation --Continue gabapentin --Continue aspirin and statin  Weight loss Caregiver reports poor appetite and significant weight loss over the past weeks to month. --Nutrition consult for evaluation -Likely related to marijuana use   DVT prophylaxis:Lovenox Code Status:Full Family Communication:Discussed on phone with husband and sister on 9/12 Disposition Plan: Appreciate neurology evaluation with initiation of IV methylprednisolone to continue for the next 3 to 5 days as well as adjustments to MS medications.  Continue to monitor blood pressures.  PT recommending SNF on discharge.   Consultants:  Neurology  Procedures:  As noted below  Antimicrobials:   None  Subjective: Patient seen and evaluated today with no new acute complaints or concerns. No acute concerns or events noted overnight.  She appears to be doing better overall with better blood pressure control noted.  Objective: Vitals:   02/10/19 0512 02/10/19 1434 02/10/19 2116 02/11/19 0600  BP: (!) 161/105 118/81 (!) 146/98 (!) 159/90  Pulse: 90 (!) 105 81 72  Resp: 16 18 15 18    Temp: 98.2 F (36.8 C) 98.4 F (36.9 C) 98 F (36.7 C) 98.2 F (36.8 C)  TempSrc: Oral Oral    SpO2: 92% 100% 96% 98%  Weight:      Height:        Intake/Output Summary (Last 24 hours) at 02/11/2019 1214 Last data filed at 02/11/2019 0600 Gross per 24 hour  Intake 476 ml  Output 200 ml  Net 276 ml   Filed Weights   02/08/19 1035  Weight: 52.2 kg    Examination:  General exam: Appears calm and comfortable  Respiratory system: Clear to auscultation. Respiratory effort normal. Cardiovascular system: S1 & S2 heard, RRR. No JVD, murmurs, rubs, gallops or clicks. No pedal edema. Gastrointestinal system: Abdomen is nondistended, soft and nontender. No organomegaly or masses felt. Normal bowel sounds heard. Central nervous system: Alert and awake Extremities: Symmetric 5 x 5 power. Skin: No rashes, lesions or ulcers Psychiatry: Flat affect.    Data Reviewed: I have personally reviewed following labs and imaging studies  CBC: Recent Labs  Lab 02/08/19 1354 02/09/19 0706 02/11/19 0643  WBC 6.5 7.8 7.8  NEUTROABS 4.0  --   --   HGB 12.7 14.7 12.3  HCT 40.5 45.9 40.1  MCV 97.4 95.2 96.2  PLT 208 302 300   Basic Metabolic Panel: Recent Labs  Lab 02/08/19 1354 02/09/19 0706 02/10/19 0653 02/11/19 0643  NA 144 139 140 141  K 3.3* 3.6 3.7 3.4*  CL 105 104 104 106  CO2 27 20* 23 25  GLUCOSE 87 114* 128* 132*  BUN 12 16 22* 27*  CREATININE 0.94 0.90 1.12* 1.22*  CALCIUM 9.5 9.5 9.2 8.8*  MG  --  1.9  --   --    GFR: Estimated Creatinine Clearance: 41.4 mL/min (A) (by C-G formula based  on SCr of 1.22 mg/dL (H)). Liver Function Tests: No results for input(s): AST, ALT, ALKPHOS, BILITOT, PROT, ALBUMIN in the last 168 hours. No results for input(s): LIPASE, AMYLASE in the last 168 hours. No results for input(s): AMMONIA in the last 168 hours. Coagulation Profile: No results for input(s): INR, PROTIME in the last 168 hours. Cardiac Enzymes: No results for  input(s): CKTOTAL, CKMB, CKMBINDEX, TROPONINI in the last 168 hours. BNP (last 3 results) No results for input(s): PROBNP in the last 8760 hours. HbA1C: No results for input(s): HGBA1C in the last 72 hours. CBG: No results for input(s): GLUCAP in the last 168 hours. Lipid Profile: Recent Labs    02/08/19 1354  CHOL 134  HDL 66  LDLCALC 52  TRIG 78  CHOLHDL 2.0   Thyroid Function Tests: Recent Labs    02/08/19 1354  TSH 0.768   Anemia Panel: Recent Labs    02/08/19 1354  VITAMINB12 2,120*   Sepsis Labs: No results for input(s): PROCALCITON, LATICACIDVEN in the last 168 hours.  Recent Results (from the past 240 hour(s))  SARS Coronavirus 2 [facility](Hospital order, [facility]) Nasopharyngeal Nasopharyngeal Swab     Status: None   Collection Time: 02/08/19  5:02 PM   Specimen: Nasopharyngeal Swab  Result Value Ref Range Status   SARS Coronavirus 2 NEGATIVE NEGATIVE Final    Comment: (NOTE) If result is NEGATIVE SARS-CoV-2 target nucleic acids are NOT DETECTED. The SARS-CoV-2 RNA is generally detectable in upper and lower  respiratory specimens during the acute phase of infection. The lowest  concentration of SARS-CoV-2 viral copies this assay can detect is 250  copies / mL. A negative result does not preclude SARS-CoV-2 infection  and should not be used as the sole basis for treatment or other  patient management decisions.  A negative result may occur with  improper specimen collection / handling, submission of specimen other  than nasopharyngeal swab, presence of viral mutation(s) within the  areas targeted by this assay, and inadequate number of viral copies  (<250 copies / mL). A negative result must be combined with clinical  observations, patient history, and epidemiological information. If result is POSITIVE SARS-CoV-2 target nucleic acids are DETECTED. The SARS-CoV-2 RNA is generally detectable in upper and lower  respiratory specimens dur  ing the acute phase of infection.  Positive  results are indicative of active infection with SARS-CoV-2.  Clinical  correlation with patient history and other diagnostic information is  necessary to determine patient infection status.  Positive results do  not rule out bacterial infection or co-infection with other viruses. If result is PRESUMPTIVE POSTIVE SARS-CoV-2 nucleic acids MAY BE PRESENT.   A presumptive positive result was obtained on the submitted specimen  and confirmed on repeat testing.  While 2019 novel coronavirus  (SARS-CoV-2) nucleic acids may be present in the submitted sample  additional confirmatory testing may be necessary for epidemiological  and / or clinical management purposes  to differentiate between  SARS-CoV-2 and other Sarbecovirus currently known to infect humans.  If clinically indicated additional testing with an alternate test  methodology [phone]([id number]) is advised. The SARS-CoV-2 RNA is generally  detectable in upper and lower respiratory sp ecimens during the acute  phase of infection. The expected result is Negative. Fact Sheet for Patients:  BoilerBrush.com.cyhttps://www.fda.gov/media/136312/download Fact Sheet for Healthcare Providers: https://pope.com/https://www.fda.gov/media/136313/download This test is not yet approved or cleared by the [place] FDA and has been authorized for detection and/or diagnosis of SARS-CoV-2 by  FDA under an Emergency Use Authorization (EUA).  This EUA will remain in effect (meaning this test can be used) for the duration of the COVID-19 declaration under Section 564(b)(1) of the Act, 21 U.S.C. section 360bbb-3(b)(1), unless the authorization is terminated or revoked sooner. Performed at [facility], [address] [place], [place] [address]          Radiology Studies: No results found.      Scheduled Meds: . amLODipine  10 mg Oral Daily  . aspirin  650 mg Oral Daily  . buPROPion  100 mg Oral Daily  . divalproex  500 mg Oral Daily  .  enoxaparin (LOVENOX) injection  40 mg Subcutaneous Q24H  . famotidine  20 mg Oral Daily  . gabapentin  300 mg Oral Daily  . losartan  100 mg Oral Daily  . nicotine  21 mg Transdermal Daily  . potassium chloride  40 mEq Oral BID  . rosuvastatin  5 mg Oral q1800  . sertraline  150 mg Oral Daily  . thiamine injection  100 mg Intravenous Daily  . vitamin B-12  1,000 mcg Oral Daily   Continuous Infusions: . methylPREDNISolone (SOLU-MEDROL) injection 1,000 mg (02/10/19 2052)     LOS: 2 days    Time spent: 30 minutes    Rondy Krupinski Hoover Brunette Oley Lahaie, DO [facility] Pager [phone]  If 7PM-7AM, please contact night-coverage [email or web address] Password [facility] 02/11/2019, 12:14 PM

## 2019-02-11 NOTE — Progress Notes (Signed)
PHARMACIST - PHYSICIAN COMMUNICATION  DR:  Manuella Ghazi  CONCERNING: IV- to- Oral Route Change Policy  RECOMMENDATION: This patient is receiving thiamine by the intravenous route.  Based on criteria approved by the Pharmacy and Therapeutics Committee, the intravenous medication(s) is/are being converted to the equivalent oral dose form(s).   DESCRIPTION: These criteria include:  The patient is eating (either orally or via tube) and/or has been taking other orally administered medications for a least 24 hours  The patient has no evidence of active gastrointestinal bleeding or impaired GI absorption (gastrectomy, short bowel, patient on TNA or NPO).  If you have questions about this conversion, please contact the Pharmacy Department  [x]   [phone] )  [facility] []   [phone] )  [facility] []   [phone] )  [facility] []   [phone] )  [facility] []   [phone] )  [place], [facility] 02/11/2019 12:52 PM

## 2019-02-12 LAB — BASIC METABOLIC PANEL
Anion gap: 8 (ref 5–15)
BUN: 33 mg/dL — ABNORMAL HIGH (ref 6–20)
CO2: 24 mmol/L (ref 22–32)
Calcium: 8.9 mg/dL (ref 8.9–10.3)
Chloride: 108 mmol/L (ref 98–111)
Creatinine, Ser: 1.21 mg/dL — ABNORMAL HIGH (ref 0.44–1.00)
GFR calc Af Amer: 57 mL/min — ABNORMAL LOW (ref >60)
GFR calc non Af Amer: 49 mL/min — ABNORMAL LOW (ref >60)
Glucose, Bld: 128 mg/dL — ABNORMAL HIGH (ref 70–99)
Potassium: 5.2 mmol/L — ABNORMAL HIGH (ref 3.5–5.1)
Sodium: 140 mmol/L (ref 135–145)

## 2019-02-12 LAB — MAGNESIUM: Magnesium: 2.3 mg/dL (ref 1.7–2.4)

## 2019-02-12 MED ORDER — LORAZEPAM 2 MG/ML IJ SOLN
0.5000 mg | Freq: Once | INTRAMUSCULAR | Status: AC
  Administered 2019-02-12: 0.5 mg via INTRAVENOUS
  Filled 2019-02-12: qty 1

## 2019-02-12 MED ORDER — SODIUM ZIRCONIUM CYCLOSILICATE 5 G PO PACK
5.0000 g | PACK | Freq: Once | ORAL | Status: AC
  Administered 2019-02-12: 5 g via ORAL
  Filled 2019-02-12: qty 1

## 2019-02-12 NOTE — Progress Notes (Signed)
PROGRESS NOTE    Christy Joseph  ZOX:[id number] DOB: 07/26/1960 DOA: 02/08/2019 PCP: [facility]   Brief Narrative:  Per HPI: Christy FredricksonVickie B Adamsis a 58 y.o.femalewith medical history significant ofmultiple sclerosis, CVA, seizure disorder, HTN, GERD, tobacco abuse who presents to ED with progressive weakness, confusion, and fall. Patient with baseline confusion that has progressed over the past few weeks, much of the history is obtained from her caregiver who is at bedside. Caregiver reports that patient fell out of bed this morning with a subsequent fall later this afternoon. She has noticed that she has been more confused over the last 2 days with increased weakness and bilateral leg pain. Caregiver also reports that she has been losing a fairly significant amount of weight over the past several weeks to months with decreased oral appetite and concerns about her swallowing ability especially with her home medications.   Patient [facility] with recent reduction in her leflunomide to 10 mg p.o. daily for GI side effects and her Keppra was changed to Depakote 500 mg p.o. daily which was on 01/30/2019.  ED provider reported that she has not been taking her medications in a few days, although her caregiver did state that she did today. Unclear whether she took her medications today, but suspect she has not given her severe elevated blood pressure.  9/11:Patient seen and evaluated and continues to have elevated blood pressure readings as well as some confusion. After discussion with the husband and caretaker today, it appears that there may be a combination of medication noncompliance, ongoing marijuana use, as well as recent medication changes by the neurologist that may have contributed to her symptoms. PT has evaluated the patient with recommendations for SNF on discharge.  9/12:  Continue current medications to include IV methylprednisolone as ordered by neurology.  She appears to be symptomatically improving with improved blood pressure control noted today.  9/13: Patient did have some agitation/anxiety overnight and will plan to discontinue IV Solu-Medrol as a result.  She has already received 3 days.  Will discuss with neurology in a.m. and consider discharge if stable.  Blood pressure elevated this morning.   Assessment & Plan:   Principal Problem:   Hypertensive urgency Active Problems:   Essential hypertension, benign   Hyperlipidemia LDL goal <130   Anxiety and depression   Seizure (HCC)   Multiple sclerosis (HCC)   Gait disturbance   Cognitive deficit secondary to multiple sclerosis (HCC)   CVA (cerebral vascular accident) (HCC)   Hypertensivecrisis-improved, but with ongoing poor control -Possibly secondary to medication noncomplianceas well as MS medications -Discontinue IV methylprednisolone on 9/13 to further assist blood pressure --Restart home losartan increased to 100 mg p.o. daily --Adjust amlodipine to 10 mg p.o. daily --Hydralazine 10 mg IV every 4 hoursprn --Continue monitor blood pressure closely and adjust accordingly  Chest pain-resolved likely secondary to above, resolved --Troponin trend flat --Continue to monitor on telemetry --LVEF greater than 65% with no wall motion abnormalities noted --Continue aspirin and statin, currently not on a beta-blocker --Lipid panel with LDL 52 --morphine IV prn, nitro prn  Acute encephalopathy likelymultifactorial secondary to hypertension, multiple sclerosis, dehydration, and thiamine deficiency with likely significant cognitive impairment at baseline and gait impairment -Urinalysis unrevealing. Electrolytes stable. --CT headand brain MRI with no acute findings --UDS positive for marijuana use -Treat MS flare as noted as well as supplement thiamine and monitor blood pressures  --Depakote  level is subtherapeutic at 14with Depakote increased and Keppra withheld, continue gabapentin --Supportive care -PT recommending SNF on discharge which is appropriate  Hypokalemia, now with hyperkalemia -Monitor a.m. labs as elevation is mild after 2 doses of oral potassium yesterday -We will administer 1 dose of Lokelma today  History of multiple sclerosis Diagnosed initially in 2012 following presentation with decreased balance and gait. Now with asymmetric weakness and falls. Recent changes in her medications with a decrease of leflunomide to 10 mg p.o. daily on 01/30/2019. Patient utilizes a walker at baseline. --Continue leflunomide at 10 mg p.o. daily --MRI of brain without active flare noted --Neurology consultationwith methylprednisolone 1 g daily started on 9/10 with 3 days completed. --PT/OT/speech therapy consultationrecommending SNF on discharge  GERD --Continue Pepcid  Tobacco use disorderwith ongoing marijuana use Patient continues to smoke roughly 1/2 packs/day. Counseled patient although, confused even at baseline. --Nicotine patch  Hx CVA Hx Seizure disorder Currently on Depakote 500 mg p.o. daily [facility] [place]. --Continue Depakote 500 mg p.o. daily;appreciate neurology evaluation --Continue gabapentin --Continue aspirin and statin  Weight loss Caregiver reports poor appetite and significant weight loss over the past weeks to month. --Nutrition consult for evaluation -Likely related to marijuana use   DVT prophylaxis:Lovenox Code Status:Full Family Communication:Discussed on phone with husband and sisteron 9/12 Disposition Plan:Appreciate neurology evaluation with initiation of IV methylprednisolone and will discontinue 9/13 as patient has completed 3 days.  She appears to be having a lot of side effects from the medication and therefore, I will discontinue  at this point.  We will plan for discharge to SNF in the next 24 to 48 hours if stable.   Consultants:  Neurology  Procedures:  As noted below  Antimicrobials:   None  Subjective: Patient seen and evaluated today with no new acute complaints or concerns. No acute concerns or events noted overnight.  Objective: Vitals:   02/11/19 0600 02/11/19 1439 02/11/19 2021 02/12/19 0520  BP: (!) 159/90 134/89 (!) 145/103 (!) 162/105  Pulse: 72 78 (!) 105 79  Resp: 18 16 19 14   Temp: 98.2 F (36.8 C) 98.3 F (36.8 C) 98.4 F (36.9 C) 98 F (36.7 C)  TempSrc:  Oral Oral Oral  SpO2: 98% 98% 100% 98%  Weight:      Height:        Intake/Output Summary (Last 24 hours) at 02/12/2019 0930 Last data filed at 02/12/2019 0649 Gross per 24 hour  Intake 840 ml  Output 400 ml  Net 440 ml   Filed Weights   02/08/19 1035  Weight: 52.2 kg    Examination:  General exam: Appears calm and comfortable, somnolent Respiratory system: Clear to auscultation. Respiratory effort normal. Cardiovascular system: S1 & S2 heard, RRR. No JVD, murmurs, rubs, gallops or clicks. No pedal edema. Gastrointestinal system: Abdomen is nondistended, soft and nontender. No organomegaly or masses felt. Normal bowel sounds heard. Central nervous system: Somnolent this a.m. Extremities: No significant edema Skin: No rashes, lesions or ulcers Psychiatry: Cannot be evaluated this morning    Data Reviewed: I have personally reviewed following labs and imaging studies  CBC: Recent Labs  Lab 02/08/19 1354 02/09/19 0706 02/11/19 0643  WBC 6.5 7.8 7.8  NEUTROABS 4.0  --   --   HGB 12.7 14.7 12.3  HCT 40.5 45.9 40.1  MCV 97.4 95.2 96.2  PLT 208 302 300   Basic Metabolic Panel: Recent Labs  Lab 02/08/19 1354 02/09/19 0706  02/10/19 0653 02/11/19 0643 02/12/19 0626  NA 144 139 140 141 140  K 3.3* 3.6 3.7 3.4* 5.2*  CL 105 104 104 106 108  CO2 27 20* 23 25 24   GLUCOSE 87 114* 128* 132* 128*   BUN 12 16 22* 27* 33*  CREATININE 0.94 0.90 1.12* 1.22* 1.21*  CALCIUM 9.5 9.5 9.2 8.8* 8.9  MG  --  1.9  --   --  2.3   GFR: Estimated Creatinine Clearance: 41.8 mL/min (A) (by C-G formula based on SCr of 1.21 mg/dL (H)). Liver Function Tests: No results for input(s): AST, ALT, ALKPHOS, BILITOT, PROT, ALBUMIN in the last 168 hours. No results for input(s): LIPASE, AMYLASE in the last 168 hours. No results for input(s): AMMONIA in the last 168 hours. Coagulation Profile: No results for input(s): INR, PROTIME in the last 168 hours. Cardiac Enzymes: No results for input(s): CKTOTAL, CKMB, CKMBINDEX, TROPONINI in the last 168 hours. BNP (last 3 results) No results for input(s): PROBNP in the last 8760 hours. HbA1C: No results for input(s): HGBA1C in the last 72 hours. CBG: No results for input(s): GLUCAP in the last 168 hours. Lipid Profile: No results for input(s): CHOL, HDL, LDLCALC, TRIG, CHOLHDL, LDLDIRECT in the last 72 hours. Thyroid Function Tests: No results for input(s): TSH, T4TOTAL, FREET4, T3FREE, THYROIDAB in the last 72 hours. Anemia Panel: No results for input(s): VITAMINB12, FOLATE, FERRITIN, TIBC, IRON, RETICCTPCT in the last 72 hours. Sepsis Labs: No results for input(s): PROCALCITON, LATICACIDVEN in the last 168 hours.  Recent Results (from the past 240 hour(s))  SARS Coronavirus 2 [facility] order, Performed in [facility] hospital lab) Nasopharyngeal Nasopharyngeal Swab     Status: None   Collection Time: 02/08/19  5:02 PM   Specimen: Nasopharyngeal Swab  Result Value Ref Range Status   SARS Coronavirus 2 NEGATIVE NEGATIVE Final    Comment: (NOTE) If result is NEGATIVE SARS-CoV-2 target nucleic acids are NOT DETECTED. The SARS-CoV-2 RNA is generally detectable in upper and lower  respiratory specimens during the acute phase of infection. The lowest  concentration of SARS-CoV-2 viral copies this assay can detect is 250  copies / mL. A negative result does  not preclude SARS-CoV-2 infection  and should not be used as the sole basis for treatment or other  patient management decisions.  A negative result may occur with  improper specimen collection / handling, submission of specimen other  than nasopharyngeal swab, presence of viral mutation(s) within the  areas targeted by this assay, and inadequate number of viral copies  (<250 copies / mL). A negative result must be combined with clinical  observations, patient history, and epidemiological information. If result is POSITIVE SARS-CoV-2 target nucleic acids are DETECTED. The SARS-CoV-2 RNA is generally detectable in upper and lower  respiratory specimens dur ing the acute phase of infection.  Positive  results are indicative of active infection with SARS-CoV-2.  Clinical  correlation with patient history and other diagnostic information is  necessary to determine patient infection status.  Positive results do  not rule out bacterial infection or co-infection with other viruses. If result is PRESUMPTIVE POSTIVE SARS-CoV-2 nucleic acids MAY BE PRESENT.   A presumptive positive result was obtained on the submitted specimen  and confirmed on repeat testing.  While 2019 novel coronavirus  (SARS-CoV-2) nucleic acids may be present in the submitted sample  additional confirmatory testing may be necessary for epidemiological  and / or clinical management purposes  to differentiate between  SARS-CoV-2 and  other Sarbecovirus currently known to infect humans.  If clinically indicated additional testing with an alternate test  methodology [id number]) is advised. The SARS-CoV-2 RNA is generally  detectable in upper and lower respiratory sp ecimens during the acute  phase of infection. The expected result is Negative. Fact Sheet for Patients:  BoilerBrush.com.cyhttps://www.fda.gov/media/136312/download Fact Sheet for Healthcare Providers: https://pope.com/https://www.fda.gov/media/136313/download This test is not yet approved or  cleared by the [place] FDA and has been authorized for detection and/or diagnosis of SARS-CoV-2 by FDA under an Emergency Use Authorization (EUA).  This EUA will remain in effect (meaning this test can be used) for the duration of the COVID-19 declaration under Section 564(b)(1) of the Act, 21 U.S.C. section 360bbb-3(b)(1), unless the authorization is terminated or revoked sooner. Performed at [facility], [address] [place], [place] [address]          Radiology Studies: No results found.      Scheduled Meds: . amLODipine  10 mg Oral Daily  . aspirin  650 mg Oral Daily  . buPROPion  100 mg Oral Daily  . divalproex  500 mg Oral Daily  . enoxaparin (LOVENOX) injection  40 mg Subcutaneous Q24H  . famotidine  20 mg Oral Daily  . gabapentin  300 mg Oral Daily  . losartan  100 mg Oral Daily  . nicotine  21 mg Transdermal Daily  . rosuvastatin  5 mg Oral q1800  . sertraline  150 mg Oral Daily  . thiamine  100 mg Oral Daily  . vitamin B-12  1,000 mcg Oral Daily   Continuous Infusions:   LOS: 3 days    Time spent: 30 minutes    Pratik Hoover BrunetteD Shah, DO [facility] Pager [phone]  If 7PM-7AM, please contact night-coverage [email or web address] Password [email or web address] 02/12/2019, 9:30 AM

## 2019-02-13 LAB — CBC
HCT: 40.3 % (ref 36.0–46.0)
Hemoglobin: 12.5 g/dL (ref 12.0–15.0)
MCH: 30.2 pg (ref 26.0–34.0)
MCHC: 31 g/dL (ref 30.0–36.0)
MCV: 97.3 fL (ref 80.0–100.0)
Platelets: 169 10*3/uL (ref 150–400)
RBC: 4.14 MIL/uL (ref 3.87–5.11)
RDW: 15.2 % (ref 11.5–15.5)
WBC: 7.8 10*3/uL (ref 4.0–10.5)
nRBC: 0 % (ref 0.0–0.2)

## 2019-02-13 LAB — BASIC METABOLIC PANEL
Anion gap: 8 (ref 5–15)
BUN: 32 mg/dL — ABNORMAL HIGH (ref 6–20)
CO2: 25 mmol/L (ref 22–32)
Calcium: 8.6 mg/dL — ABNORMAL LOW (ref 8.9–10.3)
Chloride: 105 mmol/L (ref 98–111)
Creatinine, Ser: 1.12 mg/dL — ABNORMAL HIGH (ref 0.44–1.00)
GFR calc Af Amer: >60 mL/min (ref >60)
GFR calc non Af Amer: 54 mL/min — ABNORMAL LOW (ref >60)
Glucose, Bld: 88 mg/dL (ref 70–99)
Potassium: 4.1 mmol/L (ref 3.5–5.1)
Sodium: 138 mmol/L (ref 135–145)

## 2019-02-13 MED ORDER — HYDRALAZINE HCL 25 MG PO TABS
25.0000 mg | ORAL_TABLET | Freq: Three times a day (TID) | ORAL | Status: DC
  Administered 2019-02-13 – 2019-02-16 (×10): 25 mg via ORAL
  Filled 2019-02-13 (×11): qty 1

## 2019-02-13 MED ORDER — LORAZEPAM 2 MG/ML IJ SOLN
0.5000 mg | Freq: Once | INTRAMUSCULAR | Status: AC
  Administered 2019-02-13: 0.5 mg via INTRAVENOUS
  Filled 2019-02-13: qty 1

## 2019-02-13 NOTE — TOC Progression Note (Addendum)
Transition of Care [facility]) - Progression Note    Patient Details  Name: Christy Joseph MRN: [id number] Date of Birth: 05-Dec-1960  [facility] CM/SW Contact  Jonnae Fonseca, Chauncey Reading, RN Phone Number: 02/13/2019, 10:16 AM  Clinical Narrative:  Placed calls and faxed out again to [facility] and [facility] for answer to SNF request.     ADDENDUM:  3832: Discussed other contracted facilities with Paulisha SW of [facility], they are contracted with [facility], will send request to [facility].     Expected Discharge Plan: [place] Barriers to Discharge: No Barriers Identified  Expected Discharge Plan and Services Expected Discharge Plan: [place] arrangements for the past 2 months: Single Family Home                        Social Determinants of Health (SDOH) Interventions    Readmission Risk Interventions No flowsheet data found.

## 2019-02-13 NOTE — Progress Notes (Signed)
PROGRESS NOTE    Christy Joseph  ZOX:[id number] DOB: 07/27/1960 DOA: 02/08/2019 [facility]   Brief Narrative:  Per HPI: Christy FredricksonVickie B Adamsis a 58 y.o.femalewith medical history significant ofmultiple sclerosis, CVA, seizure disorder, HTN, GERD, tobacco abuse who presents to ED with progressive weakness, confusion, and fall. Patient with baseline confusion that has progressed over the past few weeks, much of the history is obtained from her caregiver who is at bedside. Caregiver reports that patient fell out of bed this morning with a subsequent fall later this afternoon. She has noticed that she has been more confused over the last 2 days with increased weakness and bilateral leg pain. Caregiver also reports that she has been losing a fairly significant amount of weight over the past several weeks to months with decreased oral appetite and concerns about her swallowing ability especially with her home medications.   Patient is followed for her underlying multiple sclerosis by [facility] with recent reduction in her leflunomide to 10 mg p.o. daily for GI side effects and her Keppra was changed to Depakote 500 mg p.o. daily which was on 01/30/2019.  ED provider reported that she has not been taking her medications in a few days, although her caregiver did state that she did today. Unclear whether she took her medications today, but suspect she has not given her severe elevated blood pressure.  9/11:Patient seen and evaluated and continues to have elevated blood pressure readings as well as some confusion. After discussion with the husband and caretaker today, it appears that there may be a combination of medication noncompliance, ongoing marijuana use, as well as recent medication changes by the neurologist that may have contributed to her symptoms. PT has evaluated the patient with recommendations for SNF on  discharge.  9/12:Continue current medications to include IV methylprednisolone as ordered by neurology. She appears to be symptomatically improving with improved blood pressure control noted today.  9/13: Patient did have some agitation/anxiety overnight and will plan to discontinue IV Solu-Medrol as a result.  She has already received 3 days.  Will discuss with neurology in a.m. and consider discharge if stable.  Blood pressure elevated this morning.  9/14: Patient appears to do well this morning, but continues to have elevated blood pressure readings.  Appreciate neurology reevaluation today and will add hydralazine 3 times daily for blood pressure control.  Anticipate discharge to SNF by a.m. if stable from neurological standpoint.  Assessment & Plan:   Principal Problem:   Hypertensive urgency Active Problems:   Essential hypertension, benign   Hyperlipidemia LDL goal <130   Anxiety and depression   Seizure (HCC)   Multiple sclerosis (HCC)   Gait disturbance   Cognitive deficit secondary to multiple sclerosis (HCC)   CVA (cerebral vascular accident) (HCC)   Hypertensivecrisis-with ongoing poor control -Possibly secondary to medication noncomplianceas well as MS medications -Discontinue IV methylprednisolone on 9/13 to further assist blood pressure --Restart home losartanincreased to 100mg  p.o. daily on 9/12 --Adjust amlodipine to 10 mg p.o. daily --Hydralazine 10 mg IV every 4 hoursprn -Add hydralazine 25 mg 3 times daily on 9/14 --Continue monitor blood pressure closely and adjust accordingly  Chest pain-resolved likely secondary to above, resolved --Troponin trend flat --Continue to monitor on telemetry --LVEF greater than 65% with no wall motion abnormalities noted --Continue aspirin and statin, currently not on a beta-blocker --Lipid panel with LDL 52 --morphine IV prn, nitro prn  Acute encephalopathy likelymultifactorial secondary to hypertension,  multiple  sclerosis, dehydration, and thiamine deficiency with likely significant cognitive impairment at baseline and gait impairment -Urinalysis unrevealing. Electrolytes stable. --CT headand brain MRI with no acute findings --UDS positive for marijuana use -Treat MS flare as noted as well as supplement thiamine and monitor blood pressures --Depakote level is subtherapeutic at 14with Depakote increased and Keppra withheld, continue gabapentin --Supportive care -PT recommending SNF on discharge which is appropriate  Hypokalemia, now with hyperkalemia-improved -Monitor a.m. labs as elevation is mild after 2 doses of oral potassium yesterday -1 dose of Lokelma given on 9/13 with improvement -Recheck a.m. labs  History of multiple sclerosis Diagnosed initially in 2012 following presentation with decreased balance and gait. Now with asymmetric weakness and falls. Recent changes in her medications with a decrease of leflunomide to 10 mg p.o. daily on 01/30/2019. Patient utilizes a walker at baseline. --Continue leflunomide at 10 mg p.o. daily --MRI of brain without active flare noted --Neurology consultationwith methylprednisolone 1 g daily started on 9/10 with 3 days completed. -Appreciate reevaluation today to ensure stability for discharge in a.m. --PT/OT/speech therapy consultationrecommending SNF on discharge  GERD --Continue Pepcid  Tobacco use disorderwith ongoing marijuana use Patient continues to smoke roughly 1/2 packs/day. Counseled patient although, confused even at baseline. --Nicotine patch  Hx CVA Hx Seizure disorder Currently on Depakote 500 mg p.o. daily with recent change from [facility] [facility] on 01/30/2019. --Continue Depakote 500 mg p.o. daily;appreciate neurology evaluation --Continue gabapentin --Continue aspirin and statin  Weight loss Caregiver reports poor appetite and significant weight loss over the past weeks to  month. --Nutrition consult for evaluation -Likely related to marijuana use   DVT prophylaxis:Lovenox Code Status:Full Family Communication:Discussed on phone with husband and sisteron 9/13 Disposition Plan:Appreciate neurology evaluation with initiation of IV methylprednisolone and will discontinue 9/13 as patient has completed 3 days.  Discontinued after 3 days due to side effects.  Appreciate neurology reevaluation today to see if stable for discharge on 9/15.  Hydralazine 3 times daily added for better blood pressure control.   Consultants:  Neurology  Procedures:  As noted below  Antimicrobials:   None  Subjective: Patient seen and evaluated today with no new acute complaints or concerns. No acute concerns or events noted overnight.  She continues to have elevated blood pressure readings noted.  No acute agitation or anxiety overnight.  She appears to have slept well.  Objective: Vitals:   02/12/19 2125 02/13/19 0451 02/13/19 0812 02/13/19 0852  BP: (!) 136/96 (!) 166/116  (!) 142/107  Pulse: 92 72  98  Resp:    16  Temp: 98.6 F (37 C) (!) 97.5 F (36.4 C)  98.5 F (36.9 C)  TempSrc: Oral Oral  Oral  SpO2: 96% 96% 95% 100%  Weight:      Height:        Intake/Output Summary (Last 24 hours) at 02/13/2019 1043 Last data filed at 02/12/2019 1817 Gross per 24 hour  Intake 720 ml  Output 500 ml  Net 220 ml   Filed Weights   02/08/19 1035  Weight: 52.2 kg    Examination:  General exam: Appears calm and comfortable  Respiratory system: Clear to auscultation. Respiratory effort normal. Cardiovascular system: S1 & S2 heard, RRR. No JVD, murmurs, rubs, gallops or clicks. No pedal edema. Gastrointestinal system: Abdomen is nondistended, soft and nontender. No organomegaly or masses felt. Normal bowel sounds heard. Central nervous system: Alert and awake. Extremities: Symmetric 5 x 5 power. Skin: No rashes, lesions or  ulcers Psychiatry: Flat  affect    Data Reviewed: I have personally reviewed following labs and imaging studies  CBC: Recent Labs  Lab 02/08/19 1354 02/09/19 0706 02/11/19 0643 02/13/19 0412  WBC 6.5 7.8 7.8 7.8  NEUTROABS 4.0  --   --   --   HGB 12.7 14.7 12.3 12.5  HCT 40.5 45.9 40.1 40.3  MCV 97.4 95.2 96.2 97.3  PLT 208 302 300 169   Basic Metabolic Panel: Recent Labs  Lab 02/09/19 0706 02/10/19 0653 02/11/19 0643 02/12/19 0626 02/13/19 0412  NA 139 140 141 140 138  K 3.6 3.7 3.4* 5.2* 4.1  CL 104 104 106 108 105  CO2 20* 23 25 24 25   GLUCOSE 114* 128* 132* 128* 88  BUN 16 22* 27* 33* 32*  CREATININE 0.90 1.12* 1.22* 1.21* 1.12*  CALCIUM 9.5 9.2 8.8* 8.9 8.6*  MG 1.9  --   --  2.3  --    GFR: Estimated Creatinine Clearance: 45.1 mL/min (A) (by C-G formula based on SCr of 1.12 mg/dL (H)). Liver Function Tests: No results for input(s): AST, ALT, ALKPHOS, BILITOT, PROT, ALBUMIN in the last 168 hours. No results for input(s): LIPASE, AMYLASE in the last 168 hours. No results for input(s): AMMONIA in the last 168 hours. Coagulation Profile: No results for input(s): INR, PROTIME in the last 168 hours. Cardiac Enzymes: No results for input(s): CKTOTAL, CKMB, CKMBINDEX, TROPONINI in the last 168 hours. BNP (last 3 results) No results for input(s): PROBNP in the last 8760 hours. HbA1C: No results for input(s): HGBA1C in the last 72 hours. CBG: No results for input(s): GLUCAP in the last 168 hours. Lipid Profile: No results for input(s): CHOL, HDL, LDLCALC, TRIG, CHOLHDL, LDLDIRECT in the last 72 hours. Thyroid Function Tests: No results for input(s): TSH, T4TOTAL, FREET4, T3FREE, THYROIDAB in the last 72 hours. Anemia Panel: No results for input(s): VITAMINB12, FOLATE, FERRITIN, TIBC, IRON, RETICCTPCT in the last 72 hours. Sepsis Labs: No results for input(s): PROCALCITON, LATICACIDVEN in the last 168 hours.  Recent Results (from the past 240 hour(s))  SARS Coronavirus 2 [facility](Hospital  order, Performed in [facility] lab) Nasopharyngeal Nasopharyngeal Swab     Status: None   Collection Time: 02/08/19  5:02 PM   Specimen: Nasopharyngeal Swab  Result Value Ref Range Status   SARS Coronavirus 2 NEGATIVE NEGATIVE Final    Comment: (NOTE) If result is NEGATIVE SARS-CoV-2 target nucleic acids are NOT DETECTED. The SARS-CoV-2 RNA is generally detectable in upper and lower  respiratory specimens during the acute phase of infection. The lowest  concentration of SARS-CoV-2 viral copies this assay can detect is 250  copies / mL. A negative result does not preclude SARS-CoV-2 infection  and should not be used as the sole basis for treatment or other  patient management decisions.  A negative result may occur with  improper specimen collection / handling, submission of specimen other  than nasopharyngeal swab, presence of viral mutation(s) within the  areas targeted by this assay, and inadequate number of viral copies  (<250 copies / mL). A negative result must be combined with clinical  observations, patient history, and epidemiological information. If result is POSITIVE SARS-CoV-2 target nucleic acids are DETECTED. The SARS-CoV-2 RNA is generally detectable in upper and lower  respiratory specimens dur ing the acute phase of infection.  Positive  results are indicative of active infection with SARS-CoV-2.  Clinical  correlation with patient history and other diagnostic information is  necessary to  determine patient infection status.  Positive results do  not rule out bacterial infection or co-infection with other viruses. If result is PRESUMPTIVE POSTIVE SARS-CoV-2 nucleic acids MAY BE PRESENT.   A presumptive positive result was obtained on the submitted specimen  and confirmed on repeat testing.  While 2019 novel coronavirus  (SARS-CoV-2) nucleic acids may be present in the submitted sample  additional confirmatory testing may be necessary for epidemiological  and  / or clinical management purposes  to differentiate between  SARS-CoV-2 and other Sarbecovirus currently known to infect humans.  If clinically indicated additional testing with an alternate test  methodology [phone]([id number]) is advised. The SARS-CoV-2 RNA is generally  detectable in upper and lower respiratory sp ecimens during the acute  phase of infection. The expected result is Negative. Fact Sheet for Patients:  BoilerBrush.com.cyhttps://www.fda.gov/media/136312/download Fact Sheet for Healthcare Providers: https://pope.com/https://www.fda.gov/media/136313/download This test is not yet approved or cleared by the [place] FDA and has been authorized for detection and/or diagnosis of SARS-CoV-2 by FDA under an Emergency Use Authorization (EUA).  This EUA will remain in effect (meaning this test can be used) for the duration of the COVID-19 declaration under Section 564(b)(1) of the Act, 21 U.S.C. section 360bbb-3(b)(1), unless the authorization is terminated or revoked sooner. Performed at [facility], [address] [place], [place] [address]          Radiology Studies: No results found.      Scheduled Meds:  amLODipine  10 mg Oral Daily   aspirin  650 mg Oral Daily   buPROPion  100 mg Oral Daily   divalproex  500 mg Oral Daily   enoxaparin (LOVENOX) injection  40 mg Subcutaneous Q24H   famotidine  20 mg Oral Daily   gabapentin  300 mg Oral Daily   hydrALAZINE  25 mg Oral Q8H   losartan  100 mg Oral Daily   nicotine  21 mg Transdermal Daily   rosuvastatin  5 mg Oral q1800   sertraline  150 mg Oral Daily   thiamine  100 mg Oral Daily   vitamin B-12  1,000 mcg Oral Daily   Continuous Infusions:   LOS: 4 days    Time spent: 30 minutes    Eufemia Prindle Hoover Brunette Dub Maclellan, DO [facility] Pager [phone]  If 7PM-7AM, please contact night-coverage [email or web address] Password [email or web address] 02/13/2019, 10:43 AM

## 2019-02-14 LAB — BASIC METABOLIC PANEL
Anion gap: 8 (ref 5–15)
BUN: 30 mg/dL — ABNORMAL HIGH (ref 6–20)
CO2: 25 mmol/L (ref 22–32)
Calcium: 8.9 mg/dL (ref 8.9–10.3)
Chloride: 107 mmol/L (ref 98–111)
Creatinine, Ser: 0.98 mg/dL (ref 0.44–1.00)
GFR calc Af Amer: >60 mL/min (ref >60)
GFR calc non Af Amer: >60 mL/min (ref >60)
Glucose, Bld: 83 mg/dL (ref 70–99)
Potassium: 4.2 mmol/L (ref 3.5–5.1)
Sodium: 140 mmol/L (ref 135–145)

## 2019-02-14 MED ORDER — AMLODIPINE BESYLATE 10 MG PO TABS: 10.0000 mg | ORAL_TABLET | Freq: Every day | ORAL | 0 refills | Status: DC

## 2019-02-14 MED ORDER — HYDRALAZINE HCL 25 MG PO TABS: 25.0000 mg | ORAL_TABLET | Freq: Three times a day (TID) | ORAL | 0 refills | Status: DC

## 2019-02-14 MED ORDER — THIAMINE HCL 100 MG PO TABS: 100.0000 mg | ORAL_TABLET | Freq: Every day | ORAL | 0 refills | Status: AC

## 2019-02-14 NOTE — Progress Notes (Signed)
Physical Therapy Treatment Patient Details Name: Christy Joseph MRN: [id number] DOB: 08/12/1960 Today's Date: 02/14/2019    History of Present Illness Christy Joseph is a 58 y.o. female with medical history significant of multiple sclerosis, CVA, seizure disorder, HTN, GERD, tobacco abuse who presents to ED with progressive weakness, confusion, and fall.  Patient with baseline confusion that has progressed over the past few weeks, much of the history is obtained from her caregiver who is at bedside.  Caregiver reports that patient fell out of bed this morning with a subsequent fall later this afternoon.  She has noticed that she has been more confused over the last 2 days with increased weakness and bilateral leg pain.  Caregiver also reports that she has been losing a fairly significant amount of weight over the past several weeks to months with decreased oral appetite and concerns about her swallowing ability especially with her home medications.     PT Comments    Patient agreeable for therapy and demonstrates slow labored movement for sitting up at bedside with most difficulty using BUE to scoot self to bedside secondary to weakness.  Patient limited to ambulation in room due to buckling of knees, right hand frequently slipping off RW and had near fall when transferring to commode in bathroom.  Patient had large bowel movement and later tolerated sitting up in chair after therapy with SpO2 at 94-95% while on room air - RN notified.  Patient will benefit from continued physical therapy in hospital and recommended venue below to increase strength, balance, endurance for safe ADLs and gait.    Follow Up Recommendations  SNF;Supervision - Intermittent;Supervision for mobility/OOB     Equipment Recommendations  None recommended by PT    Recommendations for Other Services       Precautions / Restrictions Precautions Precautions: Fall Restrictions Weight Bearing Restrictions: No     Mobility  Bed Mobility Overal bed mobility: Needs Assistance Bed Mobility: Supine to Sit     Supine to sit: Min assist     General bed mobility comments: requires much time for supine to sitting with labored movement and frequent rest breaks, required assistance to scoot forward  Transfers Overall transfer level: Needs assistance Equipment used: Rolling walker (2 wheeled) Transfers: Sit to/from [facility] Sit to Stand: Min assist Stand pivot transfers: Mod assist       General transfer comment: slow labored  movement with difficulty holding onto RW with right hand, had near fall prevented by writer during transfer to commode in bathroom due to right hand slipping of RW  Ambulation/Gait Ambulation/Gait assistance: Mod assist Gait Distance (Feet): 15 Feet Assistive device: Rolling walker (2 wheeled) Gait Pattern/deviations: Decreased step length - right;Decreased step length - left;Decreased stride length;Trunk flexed Gait velocity: slow   General Gait Details: slow labored unsteady cadence with difficulty advancing BLE due to weakness, frequent buckling of knees, fatigues easily   Stairs             Wheelchair Mobility    Modified Rankin (Stroke Patients Only)       Balance Overall balance assessment: Needs assistance Sitting-balance support: Feet supported;Single extremity supported Sitting balance-Leahy Scale: Fair Sitting balance - Comments: fair static, fair/poor dynamic while completing BLE exercises, frequent falling over to the right Postural control: Right lateral lean Standing balance support: Bilateral upper extremity supported;During functional activity Standing balance-Leahy Scale: Poor Standing balance comment: fair/poor with RW due to difficulty gripping RW with right hand  Cognition Arousal/Alertness: Awake/alert Behavior During Therapy: WFL for tasks assessed/performed Overall  Cognitive Status: History of cognitive impairments - at baseline                                        Exercises General Exercises - Lower Extremity Long Arc Quad: Seated;AROM;Strengthening;Both;10 reps Hip Flexion/Marching: Seated;AROM;Strengthening;Both;10 reps Toe Raises: Seated;AROM;Strengthening;Both;10 reps Heel Raises: Seated;AROM;Strengthening;Both;10 reps    General Comments        Pertinent Vitals/Pain Pain Assessment: No/denies pain    Home Living                      Prior Function            PT Goals (current goals can now be found in the care plan section) Acute Rehab PT Goals Patient Stated Goal: To get better and go home PT Goal Formulation: With patient Time For Goal Achievement: 02/23/19 Potential to Achieve Goals: Good Progress towards PT goals: Progressing toward goals    Frequency    Min 3X/week      PT Plan Current plan remains appropriate    Co-evaluation              AM-PAC PT "6 Clicks" Mobility   Outcome Measure  Help needed turning from your back to your side while in a flat bed without using bedrails?: A Little Help needed moving from lying on your back to sitting on the side of a flat bed without using bedrails?: A Little Help needed moving to and from a bed to a chair (including a wheelchair)?: A Lot Help needed standing up from a chair using your arms (e.g., wheelchair or bedside chair)?: A Lot Help needed to walk in hospital room?: A Lot Help needed climbing 3-5 steps with a railing? : Total 6 Click Score: 13    End of Session   Activity Tolerance: Patient tolerated treatment well;Patient limited by fatigue Patient left: in chair;with call bell/phone within reach;with chair alarm set Nurse Communication: Mobility status PT Visit Diagnosis: Unsteadiness on feet (R26.81);Other abnormalities of gait and mobility (R26.89);Muscle weakness (generalized) (M62.81)     Time: 1749-4496 PT Time  Calculation (min) (ACUTE ONLY): 37 min  Charges:  $Therapeutic Exercise: 8-22 mins $Therapeutic Activity: 8-22 mins                     8:52 AM, 02/14/19 Ocie Bob, MPT Physical Therapist with [facility] 336 [phone] office [phone] mobile phone

## 2019-02-14 NOTE — Progress Notes (Signed)
  Speech Language Pathology Treatment: Dysphagia  Patient Details Name: Christy Joseph MRN: [id number] DOB: August 04, 1960 Today's Date: 02/14/2019 Time: 1287-8676 SLP Time Calculation (min) (ACUTE ONLY): 18 min  Assessment / Plan / Recommendation Clinical Impression  Pt seen for skilled observation of swallow function with slow, deliberate mastication with solids and delayed oral transit, but functional for small meals; no overt s/s of aspiration noted during session with solids and/or thin via straw sips, although oral holding noted during larger volume sips of thin liquids.  Minimal verbal cueing provided during session and SLP discussed swallowing strategies of smaller bites/sips, liquid wash prn during PO consumption and choosing foods that were easier to manipulate in oral cavity to increase PO intake overall; pt in agreement with plan, but stated she "hasn't had a good appetite lately."  Nutrition consult may be indicated prior to D/C to SNF.  ST will continue efforts during acute stay for diet tolerance/aspiration/swallowing precautions.    HPI HPI: Christy Joseph is a 58 y.o. female with medical history significant of multiple sclerosis, CVA, seizure disorder, HTN, GERD, tobacco abuse who presents to ED with progressive weakness, confusion, and fall.  Patient with baseline confusion that has progressed over the past few weeks, much of the history is obtained from her caregiver who is at bedside.  Caregiver reports that patient fell out of bed this morning with a subsequent fall later this afternoon.  She has noticed that she has been more confused over the last 2 days with increased weakness and bilateral leg pain.  Caregiver also reports that she has been losing a fairly significant amount of weight over the past several weeks to months with decreased oral appetite and concerns about her swallowing ability especially with her home medications.MRI head 02/09/19 indicated Confluent and severe signal  changes in the brain - including the brainstem - are nonspecific but compatible with advanced chronic demyelinating disease. No evidence of active demyelination. 2. Abnormal distal left vertebral artery with poor flow or less likely occlusion. No evidence of acute infarct. CTA or MRA could evaluate further.      SLP Plan  Continue with current plan of care       Recommendations  Diet recommendations: Regular;Thin liquid Liquids provided via: Cup;Straw Medication Administration: Whole meds with puree Supervision: Patient able to self feed;Intermittent supervision to cue for compensatory strategies Compensations: Follow solids with liquid Postural Changes and/or Swallow Maneuvers: Seated upright 90 degrees;Upright 30-60 min after meal                Oral Care Recommendations: Oral care BID Follow up Recommendations: Skilled Nursing facility SLP Visit Diagnosis: Dysphagia, unspecified (R13.10) Plan: Continue with current plan of care                       Elvina Sidle, M.S., CCC-SLP 02/14/2019, 11:59 AM

## 2019-02-14 NOTE — TOC Progression Note (Addendum)
Transition of Care [facility]) - Progression Note    Patient Details  Name: Christy Joseph MRN: [id number] Date of Birth: 1960-09-13  [facility] CM/SW Contact  Romualdo Prosise, Chauncey Reading, RN Phone Number: 02/14/2019, 4:34 PM  Clinical Narrative:   Patient has been declined by [facility], [place], and [place]. Awaiting decision from [place].  Sister updated. If patient is declined, sister is aware that patient will DC home (hopefully with increased hours of home care with [facility]  Or we would have to seek long term placement for patient using her Medicaid as short term rehab options are not available.    Expected Discharge Plan: [place] Barriers to Discharge: No Barriers Identified  Expected Discharge Plan and Services Expected Discharge Plan: [place] arrangements for the past 2 months: Single Family Home Expected Discharge Date: 02/14/19                    Readmission Risk Interventions No flowsheet data found.

## 2019-02-14 NOTE — Discharge Summary (Signed)
Physician Discharge Summary  Christy Joseph ZOX:[id number] DOB: 08/11/1960 DOA: 02/08/2019  [facility]  Admit date: 02/08/2019  Discharge date: 02/14/2019  Admitted From:Home  Disposition:  SNF  Recommendations for Outpatient Follow-up:  1. Follow up with PCP in 1-2 weeks 2. Follow-up with Dr. Gerilyn Pilgrimoonquah in 4 months as recommended 3. Continue on oral thiamine 4. Continue on leflunomide 10 mg daily 5. Continue on Depakote ER 250 mg tablets.  Take 2 each day 6. Continue gabapentin 7. Continue blood pressure medications as prescribed below with improved control noted  Home Health: None  Equipment/Devices: None  Discharge Condition: Stable  CODE STATUS: Full  Diet recommendation: Heart Healthy  Brief/Interim Summary: Per HPI: Christy B Adamsis a 58 y.o.femalewith medical history significant ofmultiple sclerosis, CVA, seizure disorder, HTN, GERD, tobacco abuse who presents to ED with progressive weakness, confusion, and fall. Patient with baseline confusion that has progressed over the past few weeks, much of the history is obtained from her caregiver who is at bedside. Caregiver reports that patient fell out of bed this morning with a subsequent fall later this afternoon. She has noticed that she has been more confused over the last 2 days with increased weakness and bilateral leg pain. Caregiver also reports that she has been losing a fairly significant amount of weight over the past several weeks to months with decreased oral appetite and concerns about her swallowing ability especially with her home medications.   Patient is [facility] for her underlying [facility] with recent reduction in her leflunomide to 10 mg p.o. daily for GI side effects and her Keppra was changed to Depakote 500 mg p.o. daily which was on 01/30/2019.  ED provider reported that she has not been taking her medications in a few  days, although her caregiver did state that she did today. Unclear whether she took her medications today, but suspect she has not given her severe elevated blood pressure.  9/11:Patient seen and evaluated and continues to have elevated blood pressure readings as well as some confusion. After discussion with the husband and caretaker today, it appears that there may be a combination of medication noncompliance, ongoing marijuana use, as well as recent medication changes by the neurologist that may have contributed to her symptoms. PT has evaluated the patient with recommendations for SNF on discharge.  9/12:Continue current medications to include IV methylprednisolone as ordered by neurology. She appears to be symptomatically improving with improved blood pressure control noted today.  9/13:Patient did have some agitation/anxiety overnight and will plan to discontinue IV Solu-Medrol as a result. She has already received 3 days. Will discuss with neurology in a.m. and consider discharge if stable. Blood pressure elevated this morning.  9/14: Patient appears to do well this morning, but continues to have elevated blood pressure readings.  Appreciate neurology reevaluation today and will add hydralazine 3 times daily for blood pressure control.  Anticipate discharge to SNF by a.m. if stable from neurological standpoint.  9/15: Patient is doing quite well this morning and is having breakfast.  Blood pressures are much improved after addition of hydralazine 25 mg 3 times daily.  Patient has completed course of steroids as recommended by neurology with good improvement overall noted.  She is stable for discharge to SNF/rehabilitation today.  She will continue on thiamine oral daily as recommended by neurology along with Depakote and gabapentin and leflunomide as prior.  No other acute events noted throughout the course of  this hospitalization.  Follow-up with neurology as recommended in 4  months.  Discharge Diagnoses:  Principal Problem:   Hypertensive urgency Active Problems:   Essential hypertension, benign   Hyperlipidemia LDL goal <130   Anxiety and depression   Seizure (HCC)   Multiple sclerosis (HCC)   Gait disturbance   Cognitive deficit secondary to multiple sclerosis (HCC)   CVA (cerebral vascular accident) (HCC)  Principal discharge diagnosis: Acute encephalopathy-multifactorial secondary to hypertensive crisis, multiple sclerosis flare, dehydration, and thiamine deficiency with likely significant cognitive impairment at baseline and gait impairment.  Discharge Instructions  Discharge Instructions    Diet - low sodium heart healthy   Complete by: As directed    Increase activity slowly   Complete by: As directed      Allergies as of 02/14/2019   No Known Allergies     Medication List    STOP taking these medications   losartan 50 MG tablet Commonly known as: COZAAR     TAKE these medications   acetaminophen 500 MG tablet Commonly known as: TYLENOL Take 1,000 mg by mouth every morning.   alendronate 70 MG/75ML solution Commonly known as: FOSAMAX Take 70 mg by mouth every 7 (seven) days. Take with a full glass of water on an empty stomach.   alum & mag hydroxide-simeth 200-200-20 MG/5ML suspension Commonly known as: MAALOX/MYLANTA Take 10 mLs by mouth every 6 (six) hours as needed for indigestion or heartburn.   amLODipine 10 MG tablet Commonly known as: NORVASC Take 1 tablet (10 mg total) by mouth daily. Start taking on: February 15, 2019   Anusol-HC 2.5 % rectal cream Generic drug: hydrocortisone Place 1 application rectally 2 (two) times daily.   aspirin 325 MG tablet Take 1 tablet (325 mg total) by mouth daily. What changed: how much to take   BIOFREEZE EX Apply 1 application topically 3 (three) times daily as needed. Apply to affected area(s) on back three times a day as needed for pain   buPROPion 100 MG tablet Commonly  known as: WELLBUTRIN Take 100 mg by mouth daily.   Calcium + D3 600-200 MG-UNIT Tabs Take 1 tablet by mouth daily.   cholecalciferol 25 MCG (1000 UT) tablet Commonly known as: VITAMIN D3 Take 1,000 Units by mouth daily.   divalproex 250 MG 24 hr tablet Commonly known as: Depakote ER Take 2 tablets (500 mg total) by mouth daily.   ENSURE ENLIVE PO Take 8 oz by mouth 2 (two) times daily.   famotidine 20 MG tablet Commonly known as: PEPCID Take 20 mg by mouth daily.   gabapentin 300 MG capsule Commonly known as: NEURONTIN Take 300 mg by mouth daily.   hydrALAZINE 25 MG tablet Commonly known as: APRESOLINE Take 1 tablet (25 mg total) by mouth every 8 (eight) hours.   leflunomide 10 MG tablet Commonly known as: Arava Take 10 mg daily What changed:   how much to take  how to take this  when to take this  additional instructions   potassium chloride SA 20 MEQ tablet Commonly known as: K-DUR Take 1 tablet (20 mEq total) by mouth daily.   rosuvastatin 5 MG tablet Commonly known as: Crestor Take 1 tablet (5 mg total) by mouth daily.   senna-docusate 8.6-50 MG tablet Commonly known as: Senokot-S Take 2 tablets by mouth at bedtime as needed for mild constipation.   sertraline 100 MG tablet Commonly known as: ZOLOFT Take 1.5 tablets (150 mg total) by mouth daily.   thiamine 100  MG tablet Take 1 tablet (100 mg total) by mouth daily. Start taking on: February 15, 2019   vitamin B-12 1000 MCG tablet Commonly known as: CYANOCOBALAMIN Take 1,000 mcg by mouth daily.      Follow-up [employer].   Contact information: [address] [place] [address] [phone]        [facility]ENCY [facility].   Specialty: Emergency Medicine Why: If symptoms worsen Contact information: [address] [id number] [address] [place] [id number] [phone]       Beryle Beamsoonquah, Kofi, MD Follow up in 4  month(s).   Specialty: Neurology Contact information: [address] [place] [address] [phone]          No Known Allergies  Consultations:  Neurology   Procedures/Studies: Ct Head Wo Contrast  Result Date: 02/08/2019 CLINICAL DATA:  Fall.  Altered level of consciousness. EXAM: CT HEAD WITHOUT CONTRAST TECHNIQUE: Contiguous axial images were obtained from the base of the skull through the vertex without intravenous contrast. COMPARISON:  01/08/2012 FINDINGS: Brain: Advanced atrophy and chronic small vessel disease throughout the deep white matter, significantly progressed since prior study. No acute intracranial abnormality. Specifically, no hemorrhage, hydrocephalus, mass lesion, acute infarction, or significant intracranial injury. Vascular: No hyperdense vessel or unexpected calcification. Skull: No acute calvarial abnormality. Sinuses/Orbits: Visualized paranasal sinuses and mastoids clear. Orbital soft tissues unremarkable. Other: None IMPRESSION: Advanced atrophy and chronic small vessel disease. No acute intracranial abnormality Electronically Signed   By: Charlett NoseKevin  Dover M.D.   On: 02/08/2019 19:29   Mr Laqueta JeanBrain W UYWo Contrast  Result Date: 02/09/2019 CLINICAL DATA:  58 year old female with recent fall and altered mental status. History of multiple sclerosis, query disease flare up. EXAM: MRI HEAD WITHOUT AND WITH CONTRAST TECHNIQUE: Multiplanar, multiecho pulse sequences of the brain and surrounding structures were obtained without and with intravenous contrast. CONTRAST:  5mL GADAVIST GADOBUTROL 1 MMOL/ML IV SOLN COMPARISON:  No prior MRI.  Head CTs 02/08/2019 and earlier. FINDINGS: Brain: No restricted diffusion or evidence of acute infarction. Confluent and severe abnormal T2 and FLAIR hyperintensity throughout much of the cerebral white matter, with severe atrophy of the corpus callosum (series 12, image 14). Some small areas of cortical involvement are noted (left frontal  lobe series 9, image 38). Deep white matter capsule involvement, especially the external capsules. Multifocal T2 heterogeneity in the bilateral deep gray nuclei, a especially the medial thalami (series 7, image 12). Confluent and severe T2 and FLAIR hyperintensity throughout the pons and medulla (series 9, image 13 and series 12, image 14) where diffusion appears facilitated. No associated brainstem enlargement. Comparatively mild scattered T2 and FLAIR hyperintensity in the cerebellum. The T2 signal abnormality continues to the cervicomedullary junction. No abnormal enhancement identified.  No dural thickening. No midline shift, mass effect, evidence of mass lesion, ventriculomegaly, extra-axial collection or acute intracranial hemorrhage. Pituitary within normal limits. Vascular: Loss of the distal left vertebral artery flow void in keeping with poor flow or thrombosis on series 7, image 3. There is some preserved enhancement of the distal left vertebral artery following contrast. The distal right vertebral artery and other Major intracranial vascular flow voids are preserved. The major dural venous sinuses are enhancing and appear to be patent. Skull and upper cervical spine: Grossly negative visible cervical spine and spinal cord. Visualized bone marrow signal is within normal limits. Sinuses/Orbits: Postoperative changes to both globes. Possible asymmetric T2 hyperintensity of the right optic nerve.  Paranasal sinuses are clear. Other: Mastoids are clear. Visible internal auditory structures appear normal. Scalp and face soft tissues appear negative. IMPRESSION: 1. Confluent and severe signal changes in the brain - including the brainstem - are nonspecific but compatible with advanced chronic demyelinating disease. No evidence of active demyelination. 2. Abnormal distal left vertebral artery with poor flow or less likely occlusion. No evidence of acute infarct. CTA or MRA could evaluate further. Electronically  Signed   By: Odessa FlemingH  Hall M.D.   On: 02/09/2019 10:12   Dg Knee Left Port  Result Date: 02/08/2019 CLINICAL DATA:  Pain.  History of MS. EXAM: PORTABLE LEFT KNEE - 1-2 VIEW COMPARISON:  None. FINDINGS: No evidence of fracture, dislocation, or joint effusion. No evidence of arthropathy or other focal bone abnormality. Soft tissues are unremarkable. IMPRESSION: Negative. Electronically Signed   By: Ted Mcalpineobrinka  Dimitrova M.D.   On: 02/08/2019 11:45     Discharge Exam: Vitals:   02/14/19 0553 02/14/19 0832  BP: (!) 121/92   Pulse: 98   Resp: 16   Temp: 98 F (36.7 C)   SpO2: 96% 97%   Vitals:   02/14/19 0139 02/14/19 0435 02/14/19 0553 02/14/19 0832  BP: 128/87 (!) 133/110 (!) 121/92   Pulse: 97 86 98   Resp:   16   Temp:   98 F (36.7 C)   TempSrc:   Oral   SpO2:   96% 97%  Weight:      Height:        General: Pt is alert, awake, not in acute distress Cardiovascular: RRR, S1/S2 +, no rubs, no gallops Respiratory: CTA bilaterally, no wheezing, no rhonchi Abdominal: Soft, NT, ND, bowel sounds + Extremities: no edema, no cyanosis    The results of significant diagnostics from this hospitalization (including imaging, microbiology, ancillary and laboratory) are listed below for reference.     Microbiology: Recent Results (from the past 240 hour(s))  SARS Coronavirus 2 [facility](Hospital order, Performed in [facility] hospital lab) Nasopharyngeal Nasopharyngeal Swab     Status: None   Collection Time: 02/08/19  5:02 PM   Specimen: Nasopharyngeal Swab  Result Value Ref Range Status   SARS Coronavirus 2 NEGATIVE NEGATIVE Final    Comment: (NOTE) If result is NEGATIVE SARS-CoV-2 target nucleic acids are NOT DETECTED. The SARS-CoV-2 RNA is generally detectable in upper and lower  respiratory specimens during the acute phase of infection. The lowest  concentration of SARS-CoV-2 viral copies this assay can detect is 250  copies / mL. A negative result does not preclude SARS-CoV-2 infection   and should not be used as the sole basis for treatment or other  patient management decisions.  A negative result may occur with  improper specimen collection / handling, submission of specimen other  than nasopharyngeal swab, presence of viral mutation(s) within the  areas targeted by this assay, and inadequate number of viral copies  (<250 copies / mL). A negative result must be combined with clinical  observations, patient history, and epidemiological information. If result is POSITIVE SARS-CoV-2 target nucleic acids are DETECTED. The SARS-CoV-2 RNA is generally detectable in upper and lower  respiratory specimens dur ing the acute phase of infection.  Positive  results are indicative of active infection with SARS-CoV-2.  Clinical  correlation with patient history and other diagnostic information is  necessary to determine patient infection status.  Positive results do  not rule out bacterial infection or co-infection with other viruses. If result is PRESUMPTIVE POSTIVE SARS-CoV-2 nucleic  acids MAY BE PRESENT.   A presumptive positive result was obtained on the submitted specimen  and confirmed on repeat testing.  While 2019 novel coronavirus  (SARS-CoV-2) nucleic acids may be present in the submitted sample  additional confirmatory testing may be necessary for epidemiological  and / or clinical management purposes  to differentiate between  SARS-CoV-2 and other Sarbecovirus currently known to infect humans.  If clinically indicated additional testing with an alternate test  methodology [phone]) is advised. The SARS-CoV-2 RNA is generally  detectable in upper and lower respiratory sp ecimens during the acute  phase of infection. The expected result is Negative. Fact Sheet for Patients:  BoilerBrush.com.cy Fact Sheet for Healthcare Providers: https://pope.com/ This test is not yet approved or cleared by the [place] FDA  and has been authorized for detection and/or diagnosis of SARS-CoV-2 by FDA under an Emergency Use Authorization (EUA).  This EUA will remain in effect (meaning this test can be used) for the duration of the COVID-19 declaration under Section 564(b)(1) of the Act, 21 U.S.C. section 360bbb-3(b)(1), unless the authorization is terminated or revoked sooner. Performed at [facility], [address] [place], [place] [address]      Labs: BNP (last 3 results) No results for input(s): BNP in the last 8760 hours. Basic Metabolic Panel: Recent Labs  Lab 02/09/19 0706 02/10/19 0653 02/11/19 0643 02/12/19 0626 02/13/19 0412 02/14/19 0451  NA 139 140 141 140 138 140  K 3.6 3.7 3.4* 5.2* 4.1 4.2  CL 104 104 106 108 105 107  CO2 20* 23 25 24 25 25   GLUCOSE 114* 128* 132* 128* 88 83  BUN 16 22* 27* 33* 32* 30*  CREATININE 0.90 1.12* 1.22* 1.21* 1.12* 0.98  CALCIUM 9.5 9.2 8.8* 8.9 8.6* 8.9  MG 1.9  --   --  2.3  --   --    Liver Function Tests: No results for input(s): AST, ALT, ALKPHOS, BILITOT, PROT, ALBUMIN in the last 168 hours. No results for input(s): LIPASE, AMYLASE in the last 168 hours. No results for input(s): AMMONIA in the last 168 hours. CBC: Recent Labs  Lab 02/08/19 1354 02/09/19 0706 02/11/19 0643 02/13/19 0412  WBC 6.5 7.8 7.8 7.8  NEUTROABS 4.0  --   --   --   HGB 12.7 14.7 12.3 12.5  HCT 40.5 45.9 40.1 40.3  MCV 97.4 95.2 96.2 97.3  PLT 208 302 300 169   Cardiac Enzymes: No results for input(s): CKTOTAL, CKMB, CKMBINDEX, TROPONINI in the last 168 hours. BNP: Invalid input(s): POCBNP CBG: No results for input(s): GLUCAP in the last 168 hours. D-Dimer No results for input(s): DDIMER in the last 72 hours. Hgb A1c No results for input(s): HGBA1C in the last 72 hours. Lipid Profile No results for input(s): CHOL, HDL, LDLCALC, TRIG, CHOLHDL, LDLDIRECT in the last 72 hours. Thyroid function studies No results for input(s): TSH, T4TOTAL, T3FREE, THYROIDAB  in the last 72 hours.  Invalid input(s): FREET3 Anemia work up No results for input(s): VITAMINB12, FOLATE, FERRITIN, TIBC, IRON, RETICCTPCT in the last 72 hours. Urinalysis    Component Value Date/Time   COLORURINE YELLOW 02/08/2019 1253   APPEARANCEUR CLEAR 02/08/2019 1253   LABSPEC 1.014 02/08/2019 1253   PHURINE 7.0 02/08/2019 1253   GLUCOSEU NEGATIVE 02/08/2019 1253   HGBUR NEGATIVE 02/08/2019 1253   BILIRUBINUR NEGATIVE 02/08/2019 1253   KETONESUR 5 (A) 02/08/2019 1253   PROTEINUR 100 (A) 02/08/2019 1253   UROBILINOGEN 0.2 01/08/2012 1147   NITRITE  NEGATIVE 02/08/2019 [place] 02/08/2019 1253   Sepsis Labs Invalid input(s): PROCALCITONIN,  WBC,  LACTICIDVEN Microbiology Recent Results (from the past 240 hour(s))  SARS Coronavirus 2 [facility] order, Performed in [facility] hospital lab) Nasopharyngeal Nasopharyngeal Swab     Status: None   Collection Time: 02/08/19  5:02 PM   Specimen: Nasopharyngeal Swab  Result Value Ref Range Status   SARS Coronavirus 2 NEGATIVE NEGATIVE Final    Comment: (NOTE) If result is NEGATIVE SARS-CoV-2 target nucleic acids are NOT DETECTED. The SARS-CoV-2 RNA is generally detectable in upper and lower  respiratory specimens during the acute phase of infection. The lowest  concentration of SARS-CoV-2 viral copies this assay can detect is 250  copies / mL. A negative result does not preclude SARS-CoV-2 infection  and should not be used as the sole basis for treatment or other  patient management decisions.  A negative result may occur with  improper specimen collection / handling, submission of specimen other  than nasopharyngeal swab, presence of viral mutation(s) within the  areas targeted by this assay, and inadequate number of viral copies  (<250 copies / mL). A negative result must be combined with clinical  observations, patient history, and epidemiological information. If result is POSITIVE SARS-CoV-2 target  nucleic acids are DETECTED. The SARS-CoV-2 RNA is generally detectable in upper and lower  respiratory specimens dur ing the acute phase of infection.  Positive  results are indicative of active infection with SARS-CoV-2.  Clinical  correlation with patient history and other diagnostic information is  necessary to determine patient infection status.  Positive results do  not rule out bacterial infection or co-infection with other viruses. If result is PRESUMPTIVE POSTIVE SARS-CoV-2 nucleic acids MAY BE PRESENT.   A presumptive positive result was obtained on the submitted specimen  and confirmed on repeat testing.  While 2019 novel coronavirus  (SARS-CoV-2) nucleic acids may be present in the submitted sample  additional confirmatory testing may be necessary for epidemiological  and / or clinical management purposes  to differentiate between  SARS-CoV-2 and other Sarbecovirus currently known to infect humans.  If clinically indicated additional testing with an alternate test  methodology [phone]) is advised. The SARS-CoV-2 RNA is generally  detectable in upper and lower respiratory sp ecimens during the acute  phase of infection. The expected result is Negative. Fact Sheet for Patients:  StrictlyIdeas.no Fact Sheet for Healthcare Providers: BankingDealers.co.za This test is not yet approved or cleared by the [place] FDA and has been authorized for detection and/or diagnosis of SARS-CoV-2 by FDA under an Emergency Use Authorization (EUA).  This EUA will remain in effect (meaning this test can be used) for the duration of the COVID-19 declaration under Section 564(b)(1) of the Act, 21 U.S.C. section 360bbb-3(b)(1), unless the authorization is terminated or revoked sooner. Performed at [facility], [address] [place], [place] [address]      Time coordinating discharge: 40 minutes  SIGNED:   Rodena Goldmann, DO [facility] 02/14/2019, 9:21 AM  If 7PM-7AM, please contact night-coverage [email or web address] Password [email or web address]

## 2019-02-15 DIAGNOSIS — E43 Unspecified severe protein-calorie malnutrition: Secondary | ICD-10-CM | POA: Insufficient documentation

## 2019-02-15 DIAGNOSIS — G35 Multiple sclerosis: Secondary | ICD-10-CM

## 2019-02-15 DIAGNOSIS — I160 Hypertensive urgency: Principal | ICD-10-CM

## 2019-02-15 DIAGNOSIS — R569 Unspecified convulsions: Secondary | ICD-10-CM

## 2019-02-15 LAB — CREATININE, SERUM
Creatinine, Ser: 1.02 mg/dL — ABNORMAL HIGH (ref 0.44–1.00)
GFR calc Af Amer: >60 mL/min (ref >60)
GFR calc non Af Amer: >60 mL/min (ref >60)

## 2019-02-15 MED ORDER — LORAZEPAM 1 MG PO TABS
1.0000 mg | ORAL_TABLET | Freq: Once | ORAL | Status: DC
  Filled 2019-02-15: qty 1

## 2019-02-15 NOTE — TOC Progression Note (Signed)
Transition of Care [facility]) - Progression Note    Patient Details  Name: Christy Joseph MRN: [id number] Date of Birth: May 14, 1961  Transition of Care [facility]) CM/SW Contact  Ihor Gully, LCSW Phone Number: 02/15/2019, 10:53 AM  Clinical Narrative:    Estrella Deeds at [facility] advised to send patient's SNF referral to [facility] and other facilities. If a facility not in network accepts patient, they will do a single case contract with accepting facility.    Expected Discharge Plan: [place] Barriers to Discharge: No Barriers Identified  Expected Discharge Plan and Services Expected Discharge Plan: [place] arrangements for the past 2 months: Single Family Home Expected Discharge Date: 02/14/19                                     Social Determinants of Health (SDOH) Interventions    Readmission Risk Interventions No flowsheet data found.

## 2019-02-15 NOTE — TOC Progression Note (Signed)
Transition of Care [facility]) - Progression Note    Patient Details  Name: DELILIAH SPRANGER MRN: [id number] Date of Birth: [birth year]  Transition [facility]) CM/SW Contact  Izzah Pasqua, Chauncey Reading, RN Phone Number: 02/15/2019, 12:38 PM  Clinical Narrative:   Sister updated on opening up search for SNF placement to other facilities, approved by [facility]. Answered all questions.   Expected Discharge Plan: [place] Barriers to Discharge: No Barriers Identified  Expected Discharge Plan and Services Expected Discharge Plan: [place] arrangements for the past 2 months: Single Family Home Expected Discharge Date: 02/14/19                                     Social Determinants of Health (SDOH) Interventions    Readmission Risk Interventions No flowsheet data found.

## 2019-02-15 NOTE — Progress Notes (Signed)
02/15/2019 12:51 PM  Pt remains stable to discharge to SNF. There have been issues with getting placement.  Hopefully patient can discharge in next 24 hours.  Please see dictated DC summary.  No changes to add.    Murvin Natal MD

## 2019-02-15 NOTE — TOC Progression Note (Signed)
Transition of Care [facility]) - Progression Note    Patient Details  Name: Christy Joseph MRN: [id number] Date of Birth: 1960/07/12  Transition of Care [facility]) CM/SW Contact  Ihor Gully, LCSW Phone Number: 02/15/2019, 4:26 PM  Clinical Narrative:    Estrella Deeds with [facility] contacted and indicated that she had spoken with patient's sister and the plan is for patient to discharge on 02/16/2019. [facility] will provide care for her from 8am-8p.m. at [facility]. On Monday, they will admit patient in to their contracted facility, [facility].  Attending and RN notified via secure chat.    Expected Discharge Plan: [place] Barriers to Discharge: No Barriers Identified  Expected Discharge Plan and Services Expected Discharge Plan: [place] arrangements for the past 2 months: Single Family Home Expected Discharge Date: 02/14/19                                     Social Determinants of Health (SDOH) Interventions    Readmission Risk Interventions No flowsheet data found.

## 2019-02-16 NOTE — TOC Transition Note (Signed)
Transition of Care [facility]) - CM/SW Discharge Note   Patient Details  Name: Christy Joseph MRN: [id number] Date of Birth: Sep 20, 1960  Transition of Care [facility]) CM/SW Contact:  Ihor Gully, LCSW Phone Number: 02/16/2019, 9:20 AM   Clinical Narrative:    Raquel Sarna with [facility] advised that they will pick patient up today between 11:30-12. RN, Quillian Quince, is aware. Attending notified. Message left for patient's sister, Mrs. Jerrol Banana, advising of pick up time and plans.  LCSW signing off.      Barriers to Discharge: No Barriers Identified   Patient Goals and CMS Choice        Discharge Placement                       Discharge Plan and Services                                     Social Determinants of Health (SDOH) Interventions     Readmission Risk Interventions No flowsheet data found.

## 2019-02-16 NOTE — Progress Notes (Signed)
02/16/2019 7:02 AM  Pt stable for DC.  See DC summary.  See TOC notes regarding placement.   Murvin Natal MD

## 2019-02-16 NOTE — Progress Notes (Signed)
IV removed, 2x2 gauze and paper tape applied to site, patient tolerated well.  Patient taken to lobby via wheelchair and discharge with [facility].

## 2019-04-26 ENCOUNTER — Telehealth: Payer: Self-pay | Admitting: *Deleted

## 2019-04-26 ENCOUNTER — Other Ambulatory Visit: Payer: Self-pay | Admitting: Neurology

## 2019-04-26 MED ORDER — DIVALPROEX SODIUM ER 250 MG PO TB24: 750.0000 mg | ORAL_TABLET | Freq: Every day | ORAL | 11 refills | Status: DC

## 2019-04-26 NOTE — Telephone Encounter (Signed)
Dr. Felecia Shelling received Depakote labs on this patient (collected on 04/14/2019).  Her level was low at 3.6.  He increased her Depakote ER 250mg  to three tablets daily and sent in a new prescription.  I called and spoke to the patient's sister on DPR.  Her sister informed me that all her medications are managed by [facility] in [place] (ph: [phone]).  They close at 5pm.  I left a detailed message and requested a call back to confirm they received it.

## 2019-05-01 NOTE — Telephone Encounter (Signed)
Correction to phone number below - [facility] [phone].    Carmell Austria, medication management RN has a voicemail available for patient medication changes.  I left a detailed message with the information below.  I also notified her that a new prescription was sent into [facility].  I provided our number to call back with any questions.

## 2019-05-01 NOTE — Telephone Encounter (Signed)
Received a call back from Darrelyn Hillock, RN at [facility].  She confirmed our message was received and the patient's Depakote ER has been increased.

## 2019-05-31 ENCOUNTER — Ambulatory Visit: Payer: Medicare (Managed Care) | Admitting: Neurology

## 2019-07-28 ENCOUNTER — Encounter: Payer: Self-pay | Admitting: Neurology

## 2019-08-02 ENCOUNTER — Ambulatory Visit (INDEPENDENT_AMBULATORY_CARE_PROVIDER_SITE_OTHER): Payer: Medicare (Managed Care) | Admitting: Neurology

## 2019-08-02 ENCOUNTER — Encounter: Payer: Self-pay | Admitting: Neurology

## 2019-08-02 ENCOUNTER — Other Ambulatory Visit: Payer: Self-pay

## 2019-08-02 VITALS — BP 138/90 | HR 73 | Temp 97.2°F | Ht 64.0 in | Wt 107.5 lb

## 2019-08-02 DIAGNOSIS — F329 Major depressive disorder, single episode, unspecified: Secondary | ICD-10-CM

## 2019-08-02 DIAGNOSIS — R269 Unspecified abnormalities of gait and mobility: Secondary | ICD-10-CM | POA: Diagnosis not present

## 2019-08-02 DIAGNOSIS — G35 Multiple sclerosis: Secondary | ICD-10-CM

## 2019-08-02 DIAGNOSIS — F09 Unspecified mental disorder due to known physiological condition: Secondary | ICD-10-CM

## 2019-08-02 DIAGNOSIS — F419 Anxiety disorder, unspecified: Secondary | ICD-10-CM

## 2019-08-02 DIAGNOSIS — Z79899 Other long term (current) drug therapy: Secondary | ICD-10-CM

## 2019-08-02 DIAGNOSIS — R569 Unspecified convulsions: Secondary | ICD-10-CM
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
## 2019-08-09 ENCOUNTER — Ambulatory Visit: Payer: Medicare (Managed Care) | Admitting: Neurology

## 2019-09-15 IMAGING — DX DG HUMERUS 2V *L*
2 series · 2 of 2 positions shown · non-contrast
Comparison: None.

CLINICAL DATA: Pain following fall

EXAM:
LEFT HUMERUS - 2+ VIEW

[humerus ap]
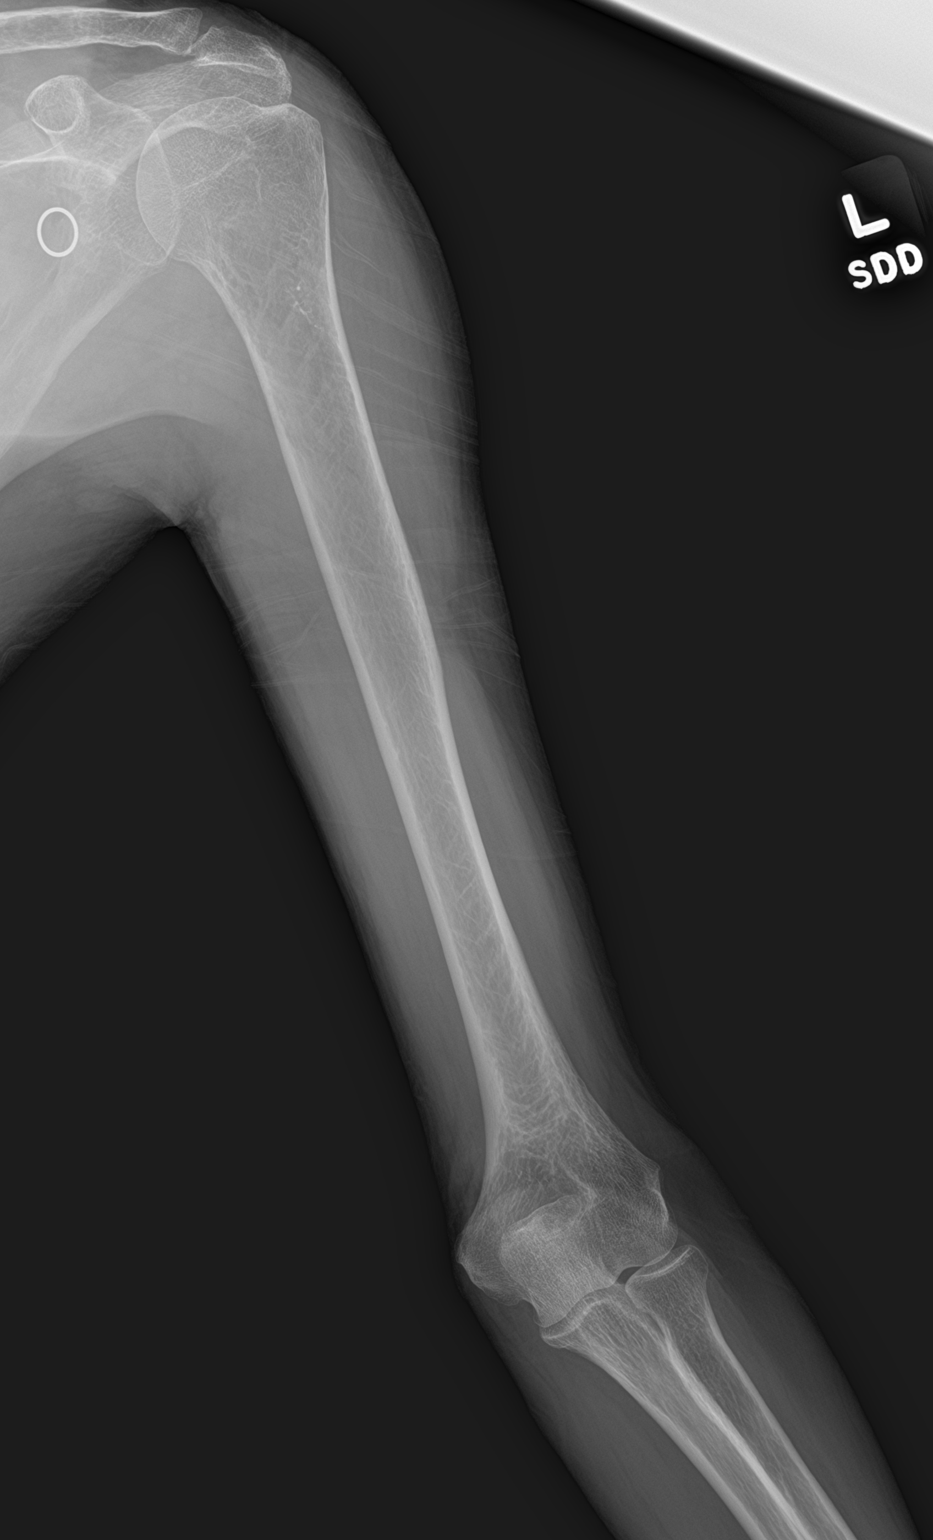

[humerus lat]
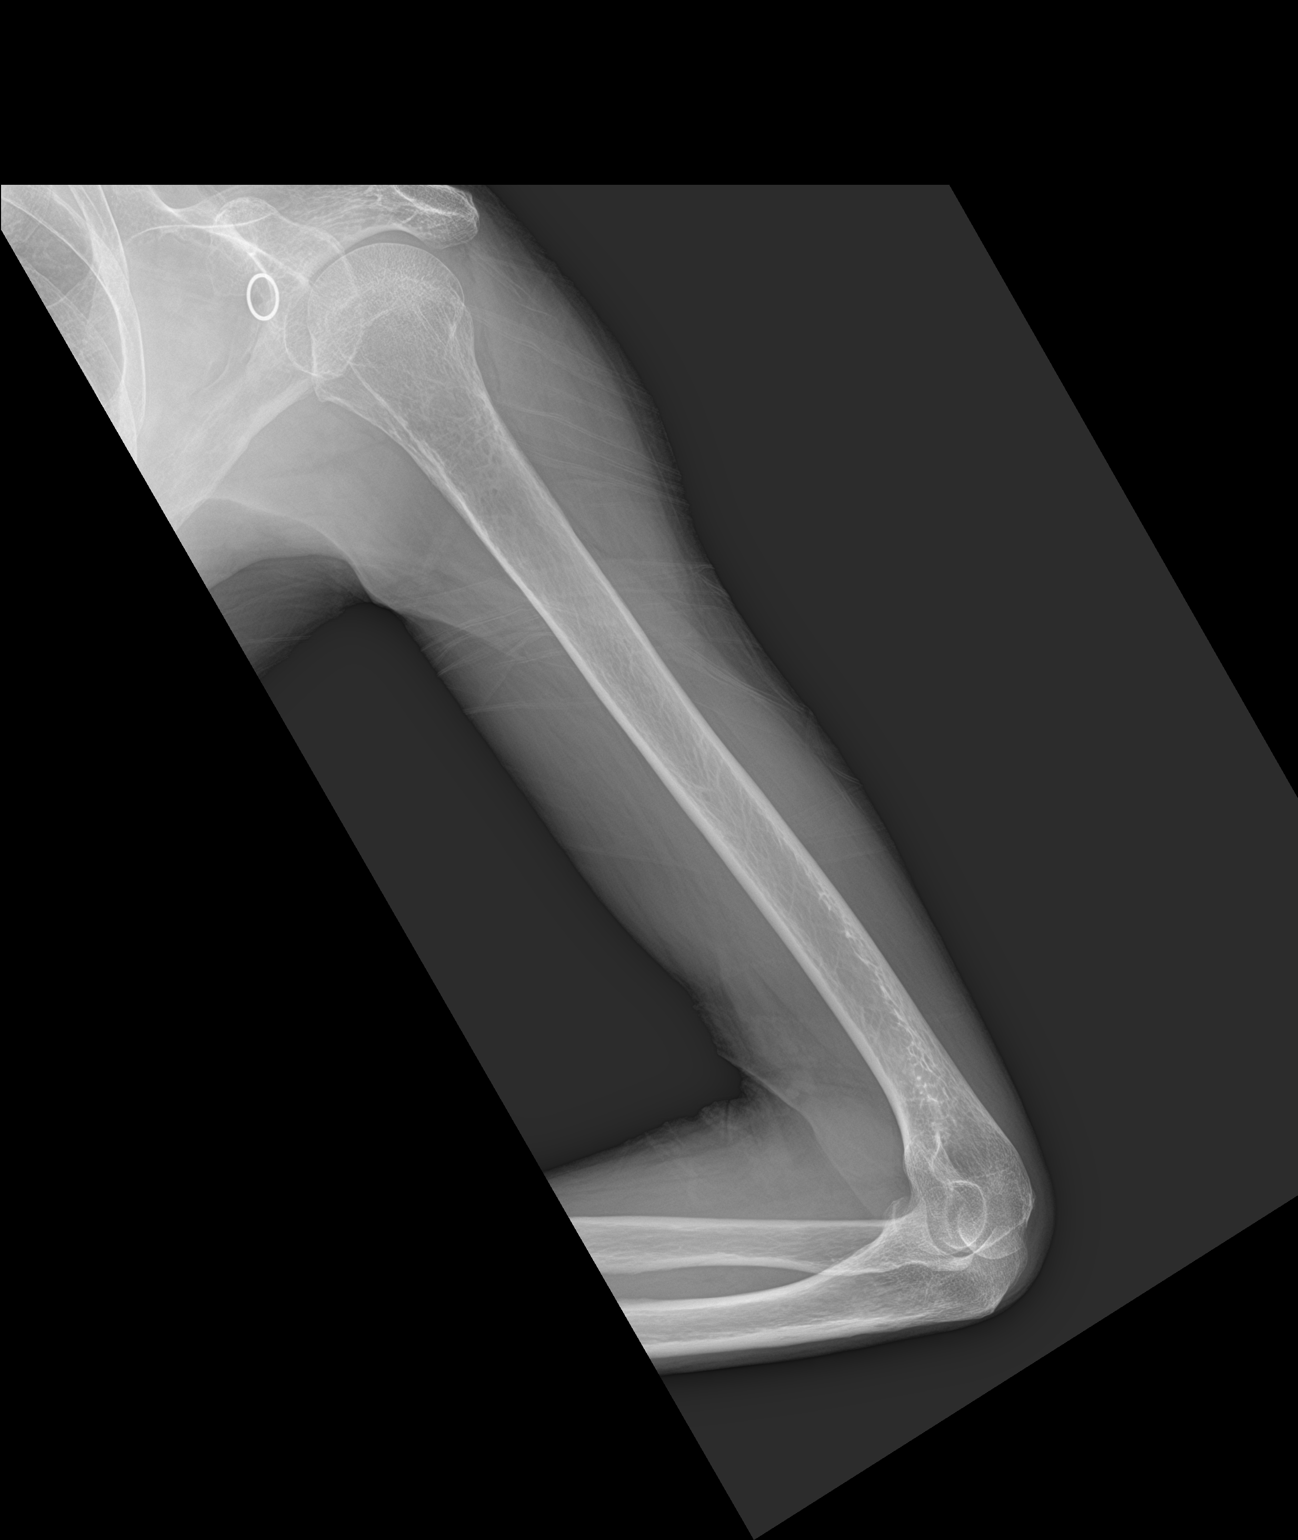

[2 of 2 positions shown; findings below may reference images not displayed]

FINDINGS: Frontal and lateral views were obtained. There is no fracture or
dislocation. Joint spaces appear normal. No abnormal periosteal
reaction.
IMPRESSION: No fracture or dislocation.  No evident arthropathy.

## 2019-09-15 IMAGING — DX DG LUMBAR SPINE COMPLETE 4+V
5 series · 5 of 5 positions shown · non-contrast
Comparison: May 28, 2016

CLINICAL DATA: Pain following fall

EXAM:
LUMBAR SPINE - COMPLETE 4+ VIEW

[l-spine ap]
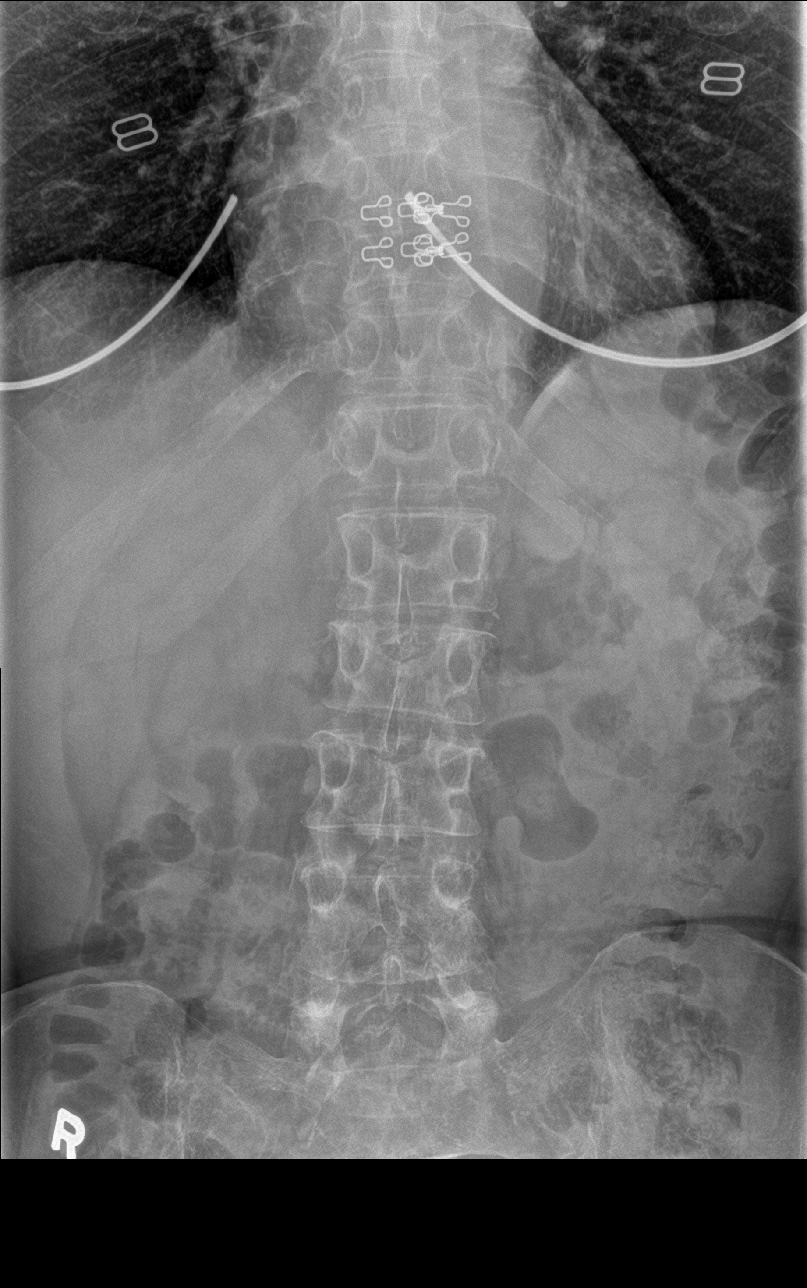

[l-spine obl (1 of 2)]
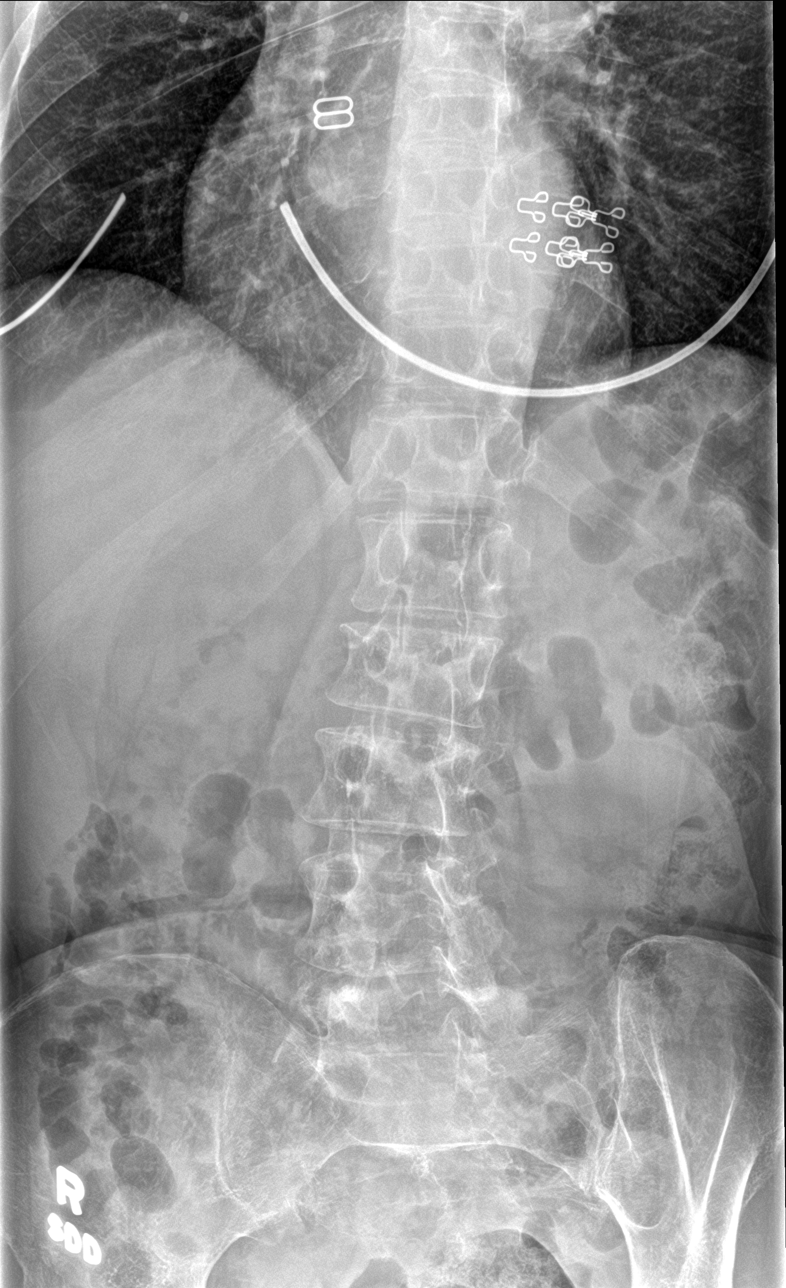

[l-spine obl (2 of 2)]
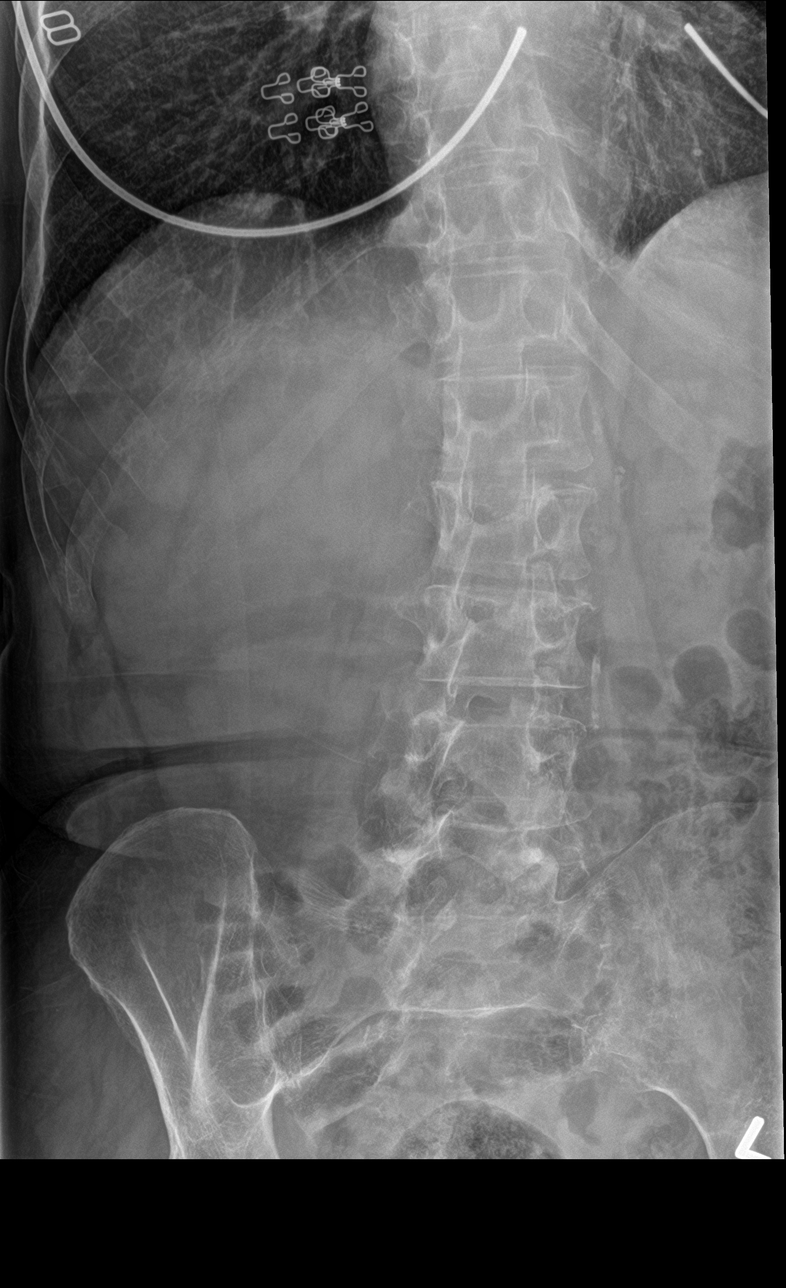

[l-spine lat]
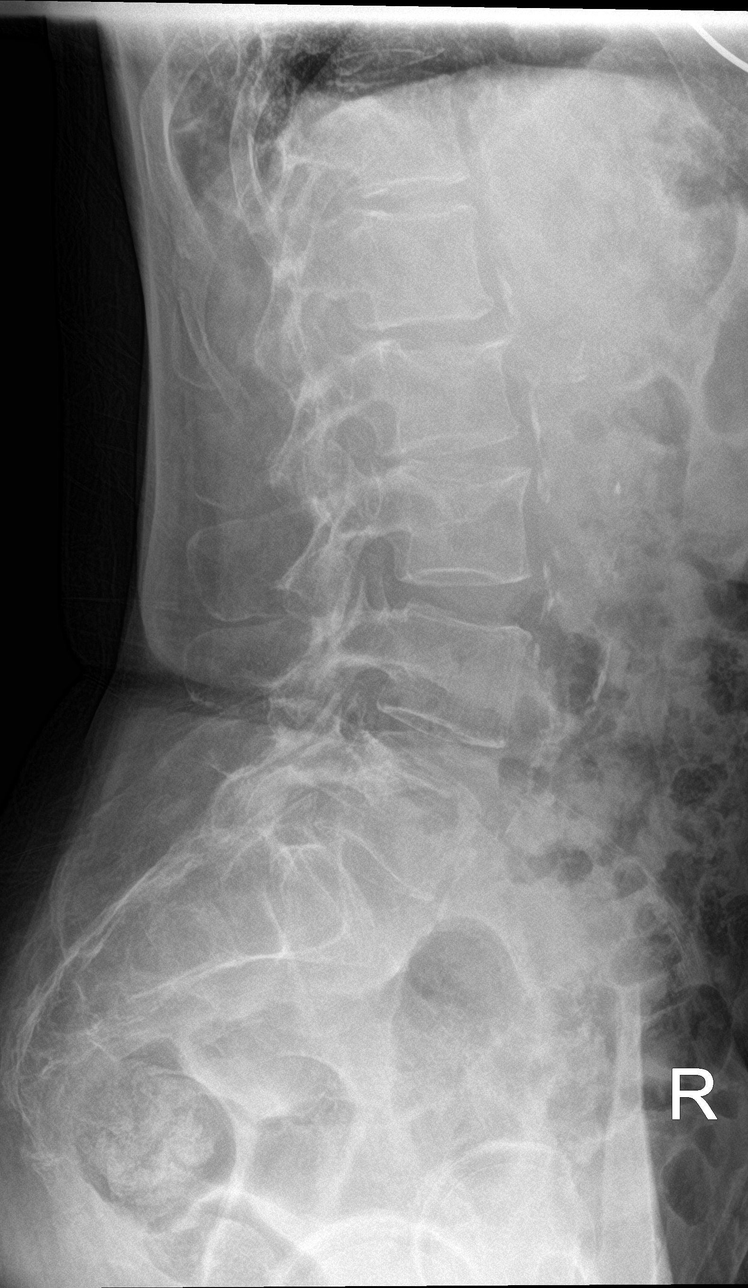

[l-spine spot]
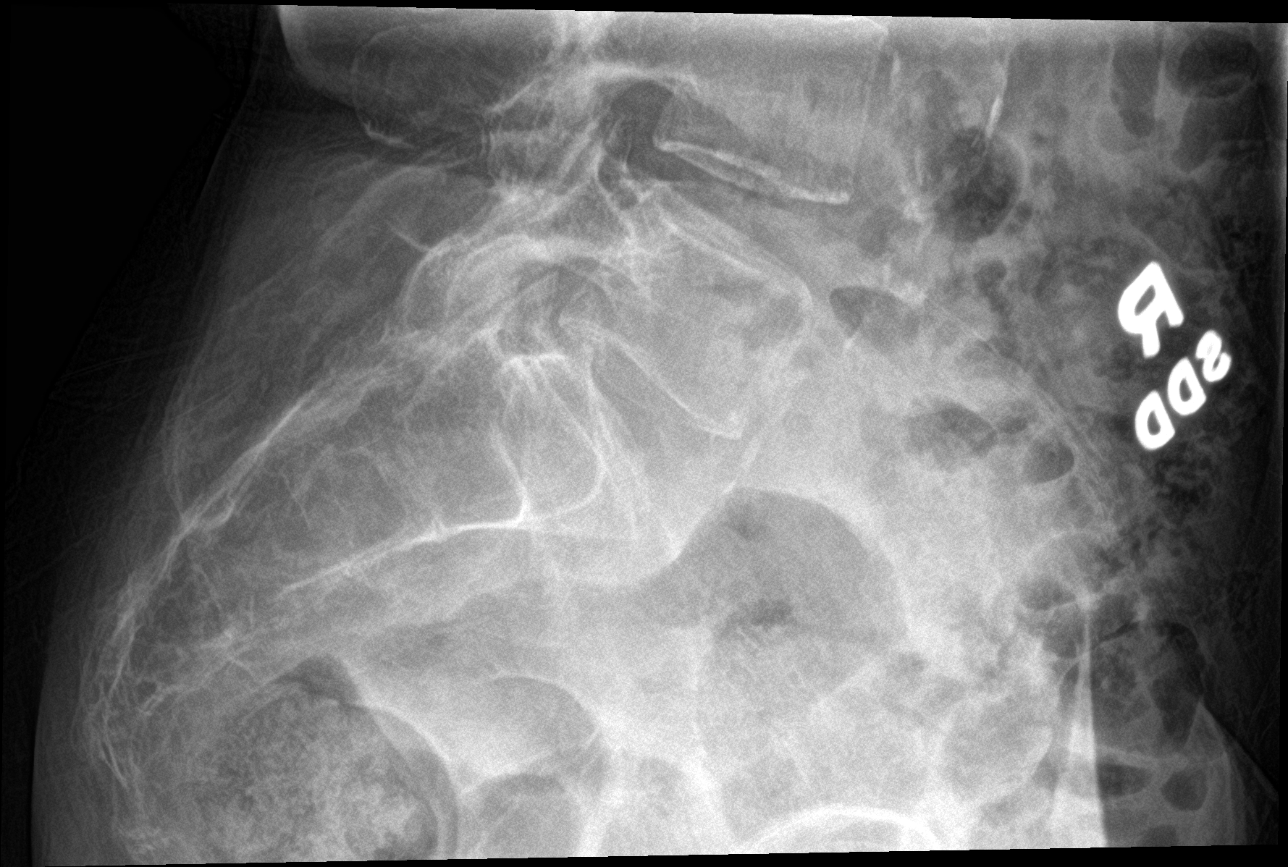

[5 of 5 positions shown; findings below may reference images not displayed]

FINDINGS: Frontal, lateral, spot lumbosacral lateral, and bilateral oblique
views were obtained. There are 5 non-rib-bearing lumbar type
vertebral bodies. There is slight mid lumbar dextroscoliosis. There
is chronic superior endplate concavity at L2 and L3, stable. No
acute fractures are evident. No spondylolisthesis. There is stable
disc space narrowing at L2-3. Other disc spaces appear unremarkable.
There is facet osteoarthritic change at L3-4, L4-5, and L5-S1
bilaterally. There is aortoiliac atherosclerosis.
IMPRESSION: Prior superior endplate fractures at L2 and L3, stable. No acute
fracture. No spondylolisthesis. Mild scoliosis. Areas of
osteoarthritic change noted, essentially stable. There is aortoiliac
atherosclerosis.

Aortic Atherosclerosis ([id number]-TVY.Y).

## 2019-10-03 ENCOUNTER — Other Ambulatory Visit: Payer: Self-pay | Admitting: Gerontology

## 2019-10-03 DIAGNOSIS — Z1231 Encounter for screening mammogram for malignant neoplasm of breast: Secondary | ICD-10-CM

## 2019-10-17 ENCOUNTER — Ambulatory Visit
Admission: RE | Admit: 2019-10-17 | Discharge: 2019-10-17 | Disposition: A | Discharge status: Home / Self Care | Payer: Medicare (Managed Care) | Source: Ambulatory Visit | Attending: Gerontology | Admitting: Gerontology

## 2019-10-17 ENCOUNTER — Other Ambulatory Visit: Payer: Self-pay

## 2019-10-17 DIAGNOSIS — Z1231 Encounter for screening mammogram for malignant neoplasm of breast: Secondary | ICD-10-CM

## 2020-02-06 ENCOUNTER — Encounter: Payer: Self-pay | Admitting: Family Medicine

## 2020-02-06 ENCOUNTER — Ambulatory Visit (INDEPENDENT_AMBULATORY_CARE_PROVIDER_SITE_OTHER): Payer: Medicare (Managed Care) | Admitting: Family Medicine

## 2020-02-06 ENCOUNTER — Other Ambulatory Visit: Payer: Self-pay

## 2020-02-06 VITALS — BP 139/93 | HR 75 | Ht 64.0 in | Wt 106.0 lb

## 2020-02-06 DIAGNOSIS — G35 Multiple sclerosis: Secondary | ICD-10-CM | POA: Diagnosis not present

## 2020-02-06 DIAGNOSIS — F329 Major depressive disorder, single episode, unspecified: Secondary | ICD-10-CM

## 2020-02-06 DIAGNOSIS — F32A Depression, unspecified: Secondary | ICD-10-CM

## 2020-02-06 DIAGNOSIS — F419 Anxiety disorder, unspecified: Secondary | ICD-10-CM

## 2020-02-06 DIAGNOSIS — F09 Unspecified mental disorder due to known physiological condition: Secondary | ICD-10-CM

## 2020-02-06 DIAGNOSIS — R269 Unspecified abnormalities of gait and mobility: Secondary | ICD-10-CM | POA: Diagnosis not present

## 2020-02-06 DIAGNOSIS — Z79899 Other long term (current) drug therapy: Secondary | ICD-10-CM | POA: Diagnosis not present

## 2020-02-06 DIAGNOSIS — R569 Unspecified convulsions: Secondary | ICD-10-CM | POA: Diagnosis not present

## 2020-02-06 NOTE — Patient Instructions (Signed)
We will continue current treatment plan.   Please continue to monitor side pain closely. I would recommend a urine analysis and culture as well as blood work if it continues. Please have [facility] send [place] copied of your labs. May consider a muscle relaxer if needed.   Fall precautions. Well balanced diet and adequate hydration encouraged.   I am so sorry to hear about your husband and your brother. Please let us know if you need anything.   Follow up with Dr Epimenio Foot in 6 months, sooner if needed   Multiple Sclerosis Multiple sclerosis (MS) is a disease of the brain, spinal cord, and optic nerves (central nervous system). It causes the body's disease-fighting (immune) system to destroy the protective covering (myelin sheath) around nerves in the brain. When this happens, signals (nerve impulses) going to and from the brain and spinal cord do not get sent properly or may not get sent at all. There are several types of MS:  Relapsing-remitting MS. This is the most common type. This causes sudden attacks of symptoms. After an attack, you may recover completely until the next attack, or some symptoms may remain permanently.  Secondary progressive MS. This usually develops after the onset of relapsing-remitting MS. Similar to relapsing-remitting MS, this type also causes sudden attacks of symptoms. Attacks may be less frequent, but symptoms slowly get worse (progress) over time.  Primary progressive MS. This causes symptoms that steadily progress over time. This type of MS does not cause sudden attacks of symptoms. The age of onset of MS varies, but it often develops between 51-38 years of age. MS is a lifelong (chronic) condition. There is no cure, but treatment can help slow down the progression of the disease. What are the causes? The cause of this condition is not known. What increases the risk? You are more likely to develop this condition if:  You are a woman.  You have a relative with MS.  However, the condition is not passed from parent to child (inherited).  You have a lack (deficiency) of vitamin D.  You smoke. MS is more common in the [place] than in the [place]. What are the signs or symptoms? Relapsing-remitting and secondary progressive MS cause symptoms to occur in episodes or attacks that may last weeks to months. There may be long periods between attacks in which there are almost no symptoms. Primary progressive MS causes symptoms to steadily progress after they develop. Symptoms of MS vary because of the many different ways it affects the central nervous system. The main symptoms include:  Vision problems and eye pain.  Numbness.  Weakness.  Inability to move your arms, hands, feet, or legs (paralysis).  Balance problems.  Shaking that you cannot control (tremors).  Muscle spasms.  Problems with thinking (cognitive changes). MS can also cause symptoms that are associated with the disease, but are not always the direct result of an MS attack. They may include:  Inability to control urination or bowel movements (incontinence).  Headaches.  Fatigue.  Inability to tolerate heat.  Emotional changes.  Depression.  Pain. How is this diagnosed? This condition is diagnosed based on:  Your symptoms.  A neurological exam. This involves checking central nervous system function, such as nerve function, reflexes, and coordination.  MRIs of the brain and spinal cord.  Lab tests, including a lumbar puncture that tests the fluid that surrounds the brain and spinal cord (cerebrospinal fluid).  Tests to measure the electrical activity of the  brain in response to stimulation (evoked potentials). How is this treated? There is no cure for MS, but medicines can help decrease the number and frequency of attacks and help relieve nuisance symptoms. Treatment options may include:  Medicines that reduce the frequency of attacks. These  medicines may be given by injection, by mouth (orally), or through an IV.  Medicines that reduce inflammation (steroids). These may provide short-term relief of symptoms.  Medicines to help control pain, depression, fatigue, or incontinence.  Vitamin D, if you have a deficiency.  Using devices to help you move around (assistive devices), such as braces, a cane, or a walker.  Physical therapy to strengthen and stretch your muscles.  Occupational therapy to help you with everyday tasks.  Alternative or complementary treatments such as exercise, massage, or acupuncture. Follow these instructions at home:  Take over-the-counter and prescription medicines only as told by your health care provider.  Do not drive or use heavy machinery while taking prescription pain medicine.  Use assistive devices as recommended by your physical therapist or your health care provider.  Exercise as directed by your health care provider.  Return to your normal activities as told by your health care provider. Ask your health care provider what activities are safe for you.  Reach out for support. Share your feelings with friends, family, or a support group.  Keep all follow-up visits as told by your health care provider and therapists. This is important. Where to find more information  National Multiple Sclerosis Society: https://www.nationalmssociety.org Contact a health care provider if:  You feel depressed.  You develop new pain or numbness.  You have tremors.  You have problems with sexual function. Get help right away if:  You develop paralysis.  You develop numbness.  You have problems with your bladder or bowel function.  You develop double vision.  You lose vision in one or both eyes.  You develop suicidal thoughts.  You develop severe confusion. If you ever feel like you may hurt yourself or others, or have thoughts about taking your own life, get help right away. You can go to  your nearest emergency department or call:  Your local emergency services (911 in the [place]  A suicide crisis helpline, such as the National Suicide Prevention Lifeline at 567 102 5518. This is open 24 hours a day. Summary  Multiple sclerosis (MS) is a disease of the central nervous system that causes the body's immune system to destroy the protective covering (myelin sheath) around nerves in the brain.  There are 3 types of MS: relapsing-remitting, secondary progressive, and primary progressive. Relapsing-remitting and secondary progressive MS cause symptoms to occur in episodes or attacks that may last weeks to months. Primary progressive MS causes symptoms to steadily progress after they develop.  There is no cure for MS, but medicines can help decrease the number and frequency of attacks and help relieve nuisance symptoms. Treatment may also include physical or occupational therapy.  If you develop numbness, paralysis, vision problems, or other neurological symptoms, get help right away. This information is not intended to replace advice given to you by your health care provider. Make sure you discuss any questions you have with your health care provider. Document Revised: 04/30/2017 Document Reviewed: 07/27/2016 Elsevier Patient Education  2020 [employer].

## 2020-02-06 NOTE — Progress Notes (Signed)
I have read the note, and I agree with the clinical assessment and plan.  Laramie Gelles A. Phyllis Whitefield, MD, PhD, FAAN Certified in Neurology, Clinical Neurophysiology, Sleep Medicine, Pain Medicine and Neuroimaging  [facility] [address], [address] [place],  [address] [phone]  
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
## 2020-04-03 ENCOUNTER — Ambulatory Visit
Admission: RE | Admit: 2020-04-03 | Discharge: 2020-04-03 | Disposition: A | Discharge status: Home / Self Care | Payer: Medicare (Managed Care) | Source: Ambulatory Visit | Attending: Gerontology | Admitting: Gerontology

## 2020-04-03 ENCOUNTER — Other Ambulatory Visit: Payer: Self-pay | Admitting: Gerontology

## 2020-04-03 DIAGNOSIS — Y92009 Unspecified place in unspecified non-institutional (private) residence as the place of occurrence of the external cause: Secondary | ICD-10-CM

## 2020-04-03 DIAGNOSIS — M810 Age-related osteoporosis without current pathological fracture: Secondary | ICD-10-CM

## 2020-04-03 DIAGNOSIS — W19XXXA Unspecified fall, initial encounter: Secondary | ICD-10-CM

## 2020-04-08 ENCOUNTER — Other Ambulatory Visit: Payer: Self-pay | Admitting: Family Medicine

## 2020-04-08 ENCOUNTER — Other Ambulatory Visit: Payer: Self-pay | Admitting: Gerontology

## 2020-04-08 DIAGNOSIS — M810 Age-related osteoporosis without current pathological fracture: Secondary | ICD-10-CM

## 2020-04-08 DIAGNOSIS — Z13820 Encounter for screening for osteoporosis: Secondary | ICD-10-CM

## 2020-04-08 DIAGNOSIS — Z1231 Encounter for screening mammogram for malignant neoplasm of breast: Secondary | ICD-10-CM

## 2020-04-08 DIAGNOSIS — S32000S Wedge compression fracture of unspecified lumbar vertebra, sequela: Secondary | ICD-10-CM

## 2020-04-09 ENCOUNTER — Ambulatory Visit (INDEPENDENT_AMBULATORY_CARE_PROVIDER_SITE_OTHER): Payer: Medicare (Managed Care) | Admitting: Family Medicine

## 2020-04-09 ENCOUNTER — Telehealth: Payer: Self-pay | Admitting: Family Medicine

## 2020-04-09 ENCOUNTER — Other Ambulatory Visit: Payer: Self-pay

## 2020-04-09 ENCOUNTER — Encounter: Payer: Self-pay | Admitting: Family Medicine

## 2020-04-09 ENCOUNTER — Ambulatory Visit
Admission: RE | Admit: 2020-04-09 | Discharge: 2020-04-09 | Disposition: A | Discharge status: Home / Self Care | Payer: Medicare (Managed Care) | Source: Ambulatory Visit | Attending: Family Medicine | Admitting: Family Medicine

## 2020-04-09 ENCOUNTER — Telehealth: Payer: Self-pay | Admitting: *Deleted

## 2020-04-09 ENCOUNTER — Other Ambulatory Visit (HOSPITAL_COMMUNITY): Payer: Self-pay | Admitting: Gerontology

## 2020-04-09 VITALS — BP 134/98 | HR 83 | Wt 106.0 lb

## 2020-04-09 DIAGNOSIS — Z79899 Other long term (current) drug therapy: Secondary | ICD-10-CM | POA: Diagnosis not present

## 2020-04-09 DIAGNOSIS — S32000S Wedge compression fracture of unspecified lumbar vertebra, sequela: Secondary | ICD-10-CM

## 2020-04-09 DIAGNOSIS — S32000A Wedge compression fracture of unspecified lumbar vertebra, initial encounter for closed fracture: Secondary | ICD-10-CM

## 2020-04-09 DIAGNOSIS — G35 Multiple sclerosis: Secondary | ICD-10-CM

## 2020-04-09 DIAGNOSIS — M810 Age-related osteoporosis without current pathological fracture: Secondary | ICD-10-CM

## 2020-04-09 DIAGNOSIS — S22000A Wedge compression fracture of unspecified thoracic vertebra, initial encounter for closed fracture: Secondary | ICD-10-CM

## 2020-04-09 DIAGNOSIS — F09 Unspecified mental disorder due to known physiological condition: Secondary | ICD-10-CM

## 2020-04-09 DIAGNOSIS — R296 Repeated falls: Secondary | ICD-10-CM

## 2020-04-09 NOTE — Patient Instructions (Addendum)
Below is our plan:  We will continue to monitor symptoms closely. I recommend imaging of brain. We can get stat CT scan if you are willing. I have called order into [facility]. You can go today.   Please make sure you are staying well hydrated. I recommend 50-60 ounces daily. Well balanced diet and regular exercise encouraged.    Please continue follow up with care team as directed.   Follow up with Dr Epimenio Foot in March, sooner as indicated by CT  You may receive a survey regarding today's visit. I encourage you to leave honest feed back as I do use this information to improve patient care. Thank you for seeing me today!      Stroke Prevention Some medical conditions and lifestyle choices can lead to a higher risk for a stroke. You can help to prevent a stroke by making nutrition, lifestyle, and other changes. What nutrition changes can be made?   Eat healthy foods. ? Choose foods that are high in fiber. These include:  Fresh fruits.  Fresh vegetables.  Whole grains. ? Eat at least 5 or more servings of fruits and vegetables each day. Try to fill half of your plate at each meal with fruits and vegetables. ? Choose lean protein foods. These include:  Lowfat (lean) cuts of meat.  Chicken without skin.  Fish.  Tofu.  Beans.  Nuts. ? Eat low-fat dairy products. ? Avoid foods that:  Are high in salt (sodium).  Have saturated fat.  Have trans fat.  Have cholesterol.  Are processed.  Are premade.  Follow eating guidelines as told by your doctor. These may include: ? Reducing how many calories you eat and drink each day. ? Limiting how much salt you eat or drink each day to 1,500 milligrams (mg). ? Using only healthy fats for cooking. These include:  Olive oil.  Canola oil.  Sunflower oil. ? Counting how many carbohydrates you eat and drink each day. What lifestyle changes can be made?  Try to stay at a healthy weight. Talk to your doctor about  what a good weight is for you.  Get at least 30 minutes of moderate physical activity at least 5 days a week. This can include: ? Fast walking. ? Biking. ? Swimming.  Do not use any products that have nicotine or tobacco. This includes cigarettes and e-cigarettes. If you need help quitting, ask your doctor. Avoid being around tobacco smoke in general.  Limit how much alcohol you drink to no more than 1 drink a day for nonpregnant women and 2 drinks a day for men. One drink equals 12 oz of beer, 5 oz of wine, or 1 oz of hard liquor.  Do not use drugs.  Avoid taking birth control pills. Talk to your doctor about the risks of taking birth control pills if: ? You are over 50 years old. ? You smoke. ? You get migraines. ? You have had a blood clot. What other changes can be made?  Manage your cholesterol. ? It is important to eat a healthy diet. ? If your cholesterol cannot be managed through your diet, you may also need to take medicines. Take medicines as told by your doctor.  Manage your diabetes. ? It is important to eat a healthy diet and to exercise regularly. ? If your blood sugar cannot be managed through diet and exercise, you may need to take medicines. Take medicines as told by your doctor.  Control your high blood pressure (hypertension). ?  Try to keep your blood pressure below 130/80. This can help lower your risk of stroke. ? It is important to eat a healthy diet and to exercise regularly. ? If your blood pressure cannot be managed through diet and exercise, you may need to take medicines. Take medicines as told by your doctor. ? Ask your doctor if you should check your blood pressure at home. ? Have your blood pressure checked every year. Do this even if your blood pressure is normal.  Talk to your doctor about getting checked for a sleep disorder. Signs of this can include: ? Snoring a lot. ? Feeling very tired.  Take over-the-counter and prescription medicines only  as told by your doctor. These may include aspirin or blood thinners (antiplatelets or anticoagulants).  Make sure that any other medical conditions you have are managed. Where to find more information  American Stroke Association: www.strokeassociation.org  National Stroke Association: www.stroke.org Get help right away if:  You have any symptoms of stroke. "BE FAST" is an easy way to remember the main warning signs: ? B - Balance. Signs are dizziness, sudden trouble walking, or loss of balance. ? E - Eyes. Signs are trouble seeing or a sudden change in how you see. ? F - Face. Signs are sudden weakness or loss of feeling of the face, or the face or eyelid drooping on one side. ? A - Arms. Signs are weakness or loss of feeling in an arm. This happens suddenly and usually on one side of the body. ? S - Speech. Signs are sudden trouble speaking, slurred speech, or trouble understanding what people say. ? T - Time. Time to call emergency services. Write down what time symptoms started.  You have other signs of stroke, such as: ? A sudden, very bad headache with no known cause. ? Feeling sick to your stomach (nausea). ? Throwing up (vomiting). ? Jerky movements you cannot control (seizure). These symptoms may represent a serious problem that is an emergency. Do not wait to see if the symptoms will go away. Get medical help right away. Call your local emergency services (911 in the [place] Do not drive yourself to the hospital. Summary  You can prevent a stroke by eating healthy, exercising, not smoking, drinking less alcohol, and treating other health problems, such as diabetes, high blood pressure, or high cholesterol.  Do not use any products that contain nicotine or tobacco, such as cigarettes and e-cigarettes.  Get help right away if you have any signs or symptoms of a stroke. This information is not intended to replace advice given to you by your health care provider. Make sure you  discuss any questions you have with your health care provider. Document Revised: 07/14/2018 Document Reviewed: 08/19/2016 Elsevier Patient Education  2020 [employer]   Multiple Sclerosis Multiple sclerosis (MS) is a disease of the brain, spinal cord, and optic nerves (central nervous system). It causes the body's disease-fighting (immune) system to destroy the protective covering (myelin sheath) around nerves in the brain. When this happens, signals (nerve impulses) going to and from the brain and spinal cord do not get sent properly or may not get sent at all. There are several types of MS:  Relapsing-remitting MS. This is the most common type. This causes sudden attacks of symptoms. After an attack, you may recover completely until the next attack, or some symptoms may remain permanently.  Secondary progressive MS. This usually develops after the onset of relapsing-remitting MS. Similar to relapsing-remitting MS,  this type also causes sudden attacks of symptoms. Attacks may be less frequent, but symptoms slowly get worse (progress) over time.  Primary progressive MS. This causes symptoms that steadily progress over time. This type of MS does not cause sudden attacks of symptoms. The age of onset of MS varies, but it often develops between 69-42 years of age. MS is a lifelong (chronic) condition. There is no cure, but treatment can help slow down the progression of the disease. What are the causes? The cause of this condition is not known. What increases the risk? You are more likely to develop this condition if:  You are a woman.  You have a relative with MS. However, the condition is not passed from parent to child (inherited).  You have a lack (deficiency) of vitamin D.  You smoke. MS is more common in the [place] than in the [place]. What are the signs or symptoms? Relapsing-remitting and secondary progressive MS cause symptoms to occur in episodes  or attacks that may last weeks to months. There may be long periods between attacks in which there are almost no symptoms. Primary progressive MS causes symptoms to steadily progress after they develop. Symptoms of MS vary because of the many different ways it affects the central nervous system. The main symptoms include:  Vision problems and eye pain.  Numbness.  Weakness.  Inability to move your arms, hands, feet, or legs (paralysis).  Balance problems.  Shaking that you cannot control (tremors).  Muscle spasms.  Problems with thinking (cognitive changes). MS can also cause symptoms that are associated with the disease, but are not always the direct result of an MS attack. They may include:  Inability to control urination or bowel movements (incontinence).  Headaches.  Fatigue.  Inability to tolerate heat.  Emotional changes.  Depression.  Pain. How is this diagnosed? This condition is diagnosed based on:  Your symptoms.  A neurological exam. This involves checking central nervous system function, such as nerve function, reflexes, and coordination.  MRIs of the brain and spinal cord.  Lab tests, including a lumbar puncture that tests the fluid that surrounds the brain and spinal cord (cerebrospinal fluid).  Tests to measure the electrical activity of the brain in response to stimulation (evoked potentials). How is this treated? There is no cure for MS, but medicines can help decrease the number and frequency of attacks and help relieve nuisance symptoms. Treatment options may include:  Medicines that reduce the frequency of attacks. These medicines may be given by injection, by mouth (orally), or through an IV.  Medicines that reduce inflammation (steroids). These may provide short-term relief of symptoms.  Medicines to help control pain, depression, fatigue, or incontinence.  Vitamin D, if you have a deficiency.  Using devices to help you move around  (assistive devices), such as braces, a cane, or a walker.  Physical therapy to strengthen and stretch your muscles.  Occupational therapy to help you with everyday tasks.  Alternative or complementary treatments such as exercise, massage, or acupuncture. Follow these instructions at home:  Take over-the-counter and prescription medicines only as told by your health care provider.  Do not drive or use heavy machinery while taking prescription pain medicine.  Use assistive devices as recommended by your physical therapist or your health care provider.  Exercise as directed by your health care provider.  Return to your normal activities as told by your health care provider. Ask your health care provider what activities are  safe for you.  Reach out for support. Share your feelings with friends, family, or a support group.  Keep all follow-up visits as told by your health care provider and therapists. This is important. Where to find more information  National Multiple Sclerosis Society: https://www.nationalmssociety.org Contact a health care provider if:  You feel depressed.  You develop new pain or numbness.  You have tremors.  You have problems with sexual function. Get help right away if:  You develop paralysis.  You develop numbness.  You have problems with your bladder or bowel function.  You develop double vision.  You lose vision in one or both eyes.  You develop suicidal thoughts.  You develop severe confusion. If you ever feel like you may hurt yourself or others, or have thoughts about taking your own life, get help right away. You can go to your nearest emergency department or call:  Your local emergency services (911 in the [place]  A suicide crisis helpline, such as the National Suicide Prevention Lifeline at 905-097-4471. This is open 24 hours a day. Summary  Multiple sclerosis (MS) is a disease of the central nervous system that causes the body's  immune system to destroy the protective covering (myelin sheath) around nerves in the brain.  There are 3 types of MS: relapsing-remitting, secondary progressive, and primary progressive. Relapsing-remitting and secondary progressive MS cause symptoms to occur in episodes or attacks that may last weeks to months. Primary progressive MS causes symptoms to steadily progress after they develop.  There is no cure for MS, but medicines can help decrease the number and frequency of attacks and help relieve nuisance symptoms. Treatment may also include physical or occupational therapy.  If you develop numbness, paralysis, vision problems, or other neurological symptoms, get help right away. This information is not intended to replace advice given to you by your health care provider. Make sure you discuss any questions you have with your health care provider. Document Revised: 04/30/2017 Document Reviewed: 07/27/2016 Elsevier Patient Education  2020 [employer].

## 2020-04-09 NOTE — Telephone Encounter (Signed)
I called [facility] and [facility] for them that needed more information concerning why we are seeing pt.  I called pts home.  I spoke to her sister.  Pt was in the process of going to [facility] this am as we are speaking.  She had fall last week.  She has new fractures, along with old fractures in her sacral/pelvis (previous surgery).  The fall felt like weakness related to ms exacerbation.  Spoke to [place] at [facility] in medical records.  She will fax imag reports and progress note.

## 2020-04-09 NOTE — Telephone Encounter (Signed)
[facility] no auth. patient is going to walk into GI. They will reach out to the patient to schedule.
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
## 2020-04-10 ENCOUNTER — Telehealth: Payer: Self-pay | Admitting: Family Medicine

## 2020-04-10 DIAGNOSIS — G35 Multiple sclerosis: Secondary | ICD-10-CM

## 2020-04-10 NOTE — Telephone Encounter (Signed)
Can you let her sister know that we reviewed the CT head images from yesterday. There were no acute intracranial abnormalities like a brain bleed that we could see. As discussed with them in the office, we should also get an MRI and I will order that today. Hopefully she can get that done with spinal imaging already planned. We will makes sure that everything looks ok from an MS/CVA standpoint. Make sure the she has followed up with [facility] regarding pain. TY!

## 2020-04-10 NOTE — Telephone Encounter (Signed)
I called sister of pt and relayed CT head results no intracranial abnormalities which is good.  Will proceed to r/o MS/stroke in MRI brain/ cervical.  Already has MRI Thoracic and lumbar for 04-22-20 (those scheduled by [facility] at [facility] long.  She verbalized understanding.

## 2020-04-15 NOTE — Telephone Encounter (Signed)
[facility] order sent to [facility]. No auth they will reach out to the patient to schedule.

## 2020-04-17 ENCOUNTER — Encounter: Payer: Self-pay | Admitting: Podiatry

## 2020-04-17 ENCOUNTER — Ambulatory Visit (INDEPENDENT_AMBULATORY_CARE_PROVIDER_SITE_OTHER): Payer: Medicare (Managed Care) | Admitting: Podiatry

## 2020-04-17 ENCOUNTER — Other Ambulatory Visit: Payer: Self-pay

## 2020-04-17 DIAGNOSIS — L603 Nail dystrophy: Secondary | ICD-10-CM | POA: Diagnosis not present

## 2020-04-17 DIAGNOSIS — L600 Ingrowing nail: Secondary | ICD-10-CM

## 2020-04-17 NOTE — Progress Notes (Signed)
Subjective:  Patient ID: Christy Joseph, female    DOB: 05/04/1961,  MRN: [id number]  Chief Complaint  Patient presents with  . Ingrown Toenail    left great toe; ingrown toenail     59 y.o. female presents with the above complaint.  Patient presents with complaint of bilateral hallux ingrown.  Patient does not know which one is causing her pain secondary to her history of dementia and short-term memory loss.  She but she currently does not have any pain.  She wants to know if there is anything that can be done for this ingrown.  She is wants to have it removed however she cannot recall which one to have removed.  She denies any other acute complaints she has not seen anyone else prior to seeing me.  She does not have any clinical signs of infection.   Review of Systems: Negative except as noted in the HPI. Denies N/V/F/Ch.  Past Medical History:  Diagnosis Date  . Cataract 2016   bilateral cataract surgery  . Depression   . Hypercholesteremia   . Hypertension   . Incontinence of bowel   . Incontinence of urine   . Multiple sclerosis (HCC)   . Seizures (HCC)    staretd 3 years ago, not sure if precipitated by MS or not but on Keppra. Last seizure was 6 months ago.  . Stroke (HCC)    4-5 years ago. Short term memory loss.    Current Outpatient Medications:  .  acetaminophen (TYLENOL) 500 MG tablet, Take 1,000 mg by mouth every morning. , Disp: , Rfl:  .  alendronate (FOSAMAX) 70 MG/75ML solution, Take 70 mg by mouth every 7 (seven) days. Take with a full glass of water on an empty stomach., Disp: , Rfl:  .  alum & mag hydroxide-simeth (MAALOX/MYLANTA) 200-200-20 MG/5ML suspension, Take 10 mLs by mouth every 6 (six) hours as needed for indigestion or heartburn., Disp: , Rfl:  .  amLODipine (NORVASC) 10 MG tablet, Take 1 tablet (10 mg total) by mouth daily., Disp: 30 tablet, Rfl: 0 .  aspirin 325 MG tablet, Take 1 tablet (325 mg total) by mouth daily. (Patient taking differently: Take  650 mg by mouth daily. ), Disp: 90 tablet, Rfl: 3 .  buPROPion (WELLBUTRIN) 100 MG tablet, Take 100 mg by mouth daily., Disp: , Rfl:  .  Calcium Carb-Cholecalciferol (CALCIUM + D3) 600-200 MG-UNIT TABS, Take 1 tablet by mouth daily., Disp: 90 tablet, Rfl: 3 .  cholecalciferol (VITAMIN D3) 25 MCG (1000 UT) tablet, Take 1,000 Units by mouth daily., Disp: , Rfl:  .  divalproex (DEPAKOTE ER) 250 MG 24 hr tablet, Take 3 tablets (750 mg total) by mouth daily., Disp: 90 tablet, Rfl: 11 .  famotidine (PEPCID) 20 MG tablet, Take 20 mg by mouth daily., Disp: , Rfl:  .  gabapentin (NEURONTIN) 300 MG capsule, Take 300 mg by mouth daily., Disp: , Rfl:  .  hydrALAZINE (APRESOLINE) 25 MG tablet, Take 1 tablet (25 mg total) by mouth every 8 (eight) hours., Disp: 90 tablet, Rfl: 0 .  hydrocortisone (ANUSOL-HC) 2.5 % rectal cream, Place 1 application rectally 2 (two) times daily., Disp: , Rfl:  .  leflunomide (ARAVA) 10 MG tablet, Take 10 mg daily (Patient taking differently: Take 10 mg by mouth daily. ), Disp: 30 tablet, Rfl: 11 .  Menthol, Topical Analgesic, (BIOFREEZE EX), Apply 1 application topically 3 (three) times daily as needed. Apply to affected area(s) on back three times a day  as needed for pain, Disp: , Rfl:  .  Nutritional Supplements (ENSURE ENLIVE PO), Take 8 oz by mouth 2 (two) times daily., Disp: , Rfl:  .  potassium chloride SA (K-DUR) 20 MEQ tablet, Take 1 tablet (20 mEq total) by mouth daily., Disp: 10 tablet, Rfl: 0 .  rosuvastatin (CRESTOR) 5 MG tablet, Take 1 tablet (5 mg total) by mouth daily., Disp: 90 tablet, Rfl: 1 .  senna-docusate (SENOKOT-S) 8.6-50 MG tablet, Take 2 tablets by mouth at bedtime as needed for mild constipation., Disp: , Rfl:  .  sertraline (ZOLOFT) 100 MG tablet, Take 1.5 tablets (150 mg total) by mouth daily., Disp: 135 tablet, Rfl: 1 .  vitamin B-12 (CYANOCOBALAMIN) 1000 MCG tablet, Take 1,000 mcg by mouth daily., Disp: , Rfl:   Social History   Tobacco Use  Smoking  Status Current Every Day Smoker  . Packs/day: 1.00  . Years: 30.00  . Pack years: 30.00  . Types: Cigarettes  Smokeless Tobacco Never Used    No Known Allergies Objective:  There were no vitals filed for this visit. There is no height or weight on file to calculate BMI. Constitutional Well developed. Well nourished.  Vascular Dorsalis pedis pulses palpable bilaterally. Posterior tibial pulses palpable bilaterally. Capillary refill normal to all digits.  No cyanosis or clubbing noted. Pedal hair growth normal.  Neurologic Normal speech. Oriented to person, place, and time. Epicritic sensation to light touch grossly present bilaterally.  Dermatologic  no pain on palpation to medial lateral border bilateral hallux.  No signs of infection noted.  No purulent drainage expressed.  No concern for wounds.  Orthopedic: Normal joint ROM without pain or crepitus bilaterally. No visible deformities. No bony tenderness.   Radiographs: None Assessment:   1. Nail dystrophy   2. Ingrown nail of great toe of left foot   3. Ingrown nail of great toe of right foot    Plan:  Patient was evaluated and treated and all questions answered.  Bilateral hallux ingrown unknown border -I explained to the patient the etiology of ingrown and various treatment options were discussed.  Given that clinically patient is not experiencing any pain at this time I will hold off on any ingrown nail procedure.  I did perform a slant back procedure to both borders of both hallux to give her temporary relief.  I have asked her to remember which nail border is causing her pain and have her come see me right away and we can discuss having it removed.  Patient agrees with the plan.  No follow-ups on file.

## 2020-04-22 ENCOUNTER — Ambulatory Visit (HOSPITAL_COMMUNITY)
Admission: RE | Admit: 2020-04-22 | Discharge: 2020-04-22 | Disposition: A | Discharge status: Home / Self Care | Payer: Medicare (Managed Care) | Source: Ambulatory Visit | Attending: Gerontology | Admitting: Gerontology

## 2020-04-22 ENCOUNTER — Other Ambulatory Visit: Payer: Self-pay

## 2020-04-22 DIAGNOSIS — S32000S Wedge compression fracture of unspecified lumbar vertebra, sequela: Secondary | ICD-10-CM

## 2020-04-22 DIAGNOSIS — M810 Age-related osteoporosis without current pathological fracture: Secondary | ICD-10-CM

## 2020-04-30 ENCOUNTER — Other Ambulatory Visit: Payer: Self-pay

## 2020-04-30 ENCOUNTER — Encounter: Payer: Self-pay | Admitting: Orthopaedic Surgery

## 2020-04-30 ENCOUNTER — Ambulatory Visit (INDEPENDENT_AMBULATORY_CARE_PROVIDER_SITE_OTHER): Payer: Medicare (Managed Care) | Admitting: Orthopaedic Surgery

## 2020-04-30 DIAGNOSIS — S22080S Wedge compression fracture of T11-T12 vertebra, sequela: Secondary | ICD-10-CM
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
## 2020-05-01 ENCOUNTER — Ambulatory Visit
Admission: RE | Admit: 2020-05-01 | Discharge: 2020-05-01 | Disposition: A | Discharge status: Home / Self Care | Payer: Medicare (Managed Care) | Source: Ambulatory Visit | Attending: Family Medicine | Admitting: Family Medicine

## 2020-05-01 DIAGNOSIS — G35 Multiple sclerosis: Secondary | ICD-10-CM

## 2020-05-01 MED ORDER — GADOBENATE DIMEGLUMINE 529 MG/ML IV SOLN
9.0000 mL | Freq: Once | INTRAVENOUS | Status: AC | PRN
  Administered 2020-05-01: 9 mL via INTRAVENOUS

## 2020-05-01 MED ORDER — GADOBENATE DIMEGLUMINE 529 MG/ML IV SOLN
10.0000 mL | Freq: Once | INTRAVENOUS | Status: AC | PRN
  Administered 2020-05-01: 10 mL via INTRAVENOUS

## 2020-05-07 ENCOUNTER — Telehealth: Payer: Self-pay | Admitting: *Deleted

## 2020-05-07 NOTE — Telephone Encounter (Signed)
Fax confirmation received. 

## 2020-05-07 NOTE — Telephone Encounter (Signed)
Called and spoke to sister of pt.  I relayed result note to her and she had questions which I could not answer, more age related/  degenerative changes? Pt is 59 yr, also atrophy (generalized).  Pt is now residing at [facility]  (had fall, fracture/ pain) no pain at this time.  Sister has not visited as much due to COVID at site.  Would like [facility] to have results.  I will fax to them. [phone], fax [phone].  Amy NP please call sister to answer questions please.  -

## 2020-05-07 NOTE — Telephone Encounter (Signed)
-----   Message from Amy Lomax, NP sent at 05/06/2020  4:20 PM EST ----- Can you please check on patient. How is she doing since last being seen? Is pain improving? Is she doing better with cognition? We updated MR brain and cervical spine that do not show any new or worsening MS lesions but does show severe widespread white matter disease and atrophy. There was some facet hypertrophy as well with mild to moderate stenosis of cervical spine but I do not suspect this was contributing to symptoms when last seen.  

## 2020-05-07 NOTE — Telephone Encounter (Signed)
-----   Message from Shawnie Dapper, NP sent at 05/06/2020  4:20 PM EST ----- Can you please check on patient. How is she doing since last being seen? Is pain improving? Is she doing better with cognition? We updated MR brain and cervical spine that do not show any new or worsening MS lesions but does show severe widespread white matter disease and atrophy. There was some facet hypertrophy as well with mild to moderate stenosis of cervical spine but I do not suspect this was contributing to symptoms when last seen.

## 2020-05-09 DIAGNOSIS — S22080S Wedge compression fracture of T11-T12 vertebra, sequela: Secondary | ICD-10-CM | POA: Insufficient documentation

## 2020-08-01 ENCOUNTER — Ambulatory Visit (INDEPENDENT_AMBULATORY_CARE_PROVIDER_SITE_OTHER): Payer: Medicare (Managed Care) | Admitting: Surgery

## 2020-08-01 ENCOUNTER — Other Ambulatory Visit: Payer: Self-pay

## 2020-08-01 ENCOUNTER — Encounter: Payer: Self-pay | Admitting: Surgery

## 2020-08-01 ENCOUNTER — Ambulatory Visit: Payer: Self-pay

## 2020-08-01 VITALS — BP 126/93 | HR 94 | Ht 64.0 in | Wt 106.0 lb

## 2020-08-01 DIAGNOSIS — S52502A Unspecified fracture of the lower end of left radius, initial encounter for closed fracture: Secondary | ICD-10-CM | POA: Diagnosis not present

## 2020-08-01 DIAGNOSIS — M25532 Pain in left wrist: Secondary | ICD-10-CM | POA: Diagnosis not present
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
## 2020-08-08 ENCOUNTER — Ambulatory Visit (INDEPENDENT_AMBULATORY_CARE_PROVIDER_SITE_OTHER): Payer: Medicare (Managed Care) | Admitting: Neurology

## 2020-08-08 ENCOUNTER — Encounter: Payer: Self-pay | Admitting: Neurology

## 2020-08-08 VITALS — BP 132/89 | HR 96 | Ht 64.0 in

## 2020-08-08 DIAGNOSIS — G35 Multiple sclerosis: Secondary | ICD-10-CM | POA: Diagnosis not present

## 2020-08-08 DIAGNOSIS — I639 Cerebral infarction, unspecified: Secondary | ICD-10-CM

## 2020-08-08 DIAGNOSIS — Z79899 Other long term (current) drug therapy: Secondary | ICD-10-CM

## 2020-08-08 DIAGNOSIS — R569 Unspecified convulsions: Secondary | ICD-10-CM | POA: Diagnosis not present

## 2020-08-08 DIAGNOSIS — G35D Multiple sclerosis, unspecified: Secondary | ICD-10-CM

## 2020-08-08 DIAGNOSIS — F09 Unspecified mental disorder due to known physiological condition: Secondary | ICD-10-CM
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
## 2020-08-09 ENCOUNTER — Ambulatory Visit (INDEPENDENT_AMBULATORY_CARE_PROVIDER_SITE_OTHER): Payer: Medicare (Managed Care) | Admitting: Orthopaedic Surgery

## 2020-08-09 ENCOUNTER — Other Ambulatory Visit: Payer: Self-pay

## 2020-08-09 DIAGNOSIS — S63502D Unspecified sprain of left wrist, subsequent encounter: Secondary | ICD-10-CM

## 2020-08-09 LAB — HEPATIC FUNCTION PANEL
ALT: 13 IU/L (ref 0–32)
AST: 13 IU/L (ref 0–40)
Albumin: 4.5 g/dL (ref 3.8–4.9)
Alkaline Phosphatase: 100 IU/L (ref 44–121)
Bilirubin Total: <0.2 mg/dL (ref 0.0–1.2)
Bilirubin, Direct: <0.1 mg/dL (ref 0.00–0.40)
Total Protein: 7.1 g/dL (ref 6.0–8.5)

## 2020-08-09 LAB — CBC WITH DIFFERENTIAL/PLATELET
Basophils Absolute: 0.1 10*3/uL (ref 0.0–0.2)
Basos: 1 %
EOS (ABSOLUTE): 0.3 10*3/uL (ref 0.0–0.4)
Eos: 4 %
Hematocrit: 43.4 % (ref 34.0–46.6)
Hemoglobin: 14.1 g/dL (ref 11.1–15.9)
Immature Grans (Abs): 0 10*3/uL (ref 0.0–0.1)
Immature Granulocytes: 1 %
Lymphocytes Absolute: 3 10*3/uL (ref 0.7–3.1)
Lymphs: 45 %
MCH: 31.1 pg (ref 26.6–33.0)
MCHC: 32.5 g/dL (ref 31.5–35.7)
MCV: 96 fL (ref 79–97)
Monocytes Absolute: 0.5 10*3/uL (ref 0.1–0.9)
Monocytes: 8 %
Neutrophils Absolute: 2.7 10*3/uL (ref 1.4–7.0)
Neutrophils: 41 %
Platelets: 218 10*3/uL (ref 150–450)
RBC: 4.53 x10E6/uL (ref 3.77–5.28)
RDW: 13.1 % (ref 11.7–15.4)
WBC: 6.6 10*3/uL (ref 3.4–10.8)

## 2020-08-09 LAB — VALPROIC ACID LEVEL: Valproic Acid Lvl: 31 ug/mL — ABNORMAL LOW (ref 50–100)
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
## 2020-10-17 ENCOUNTER — Ambulatory Visit
Admission: RE | Admit: 2020-10-17 | Discharge: 2020-10-17 | Disposition: A | Discharge status: Home / Self Care | Payer: Medicare (Managed Care) | Source: Ambulatory Visit | Attending: Family Medicine | Admitting: Family Medicine

## 2020-10-17 ENCOUNTER — Other Ambulatory Visit: Payer: Self-pay

## 2020-10-17 DIAGNOSIS — Z1231 Encounter for screening mammogram for malignant neoplasm of breast: Secondary | ICD-10-CM

## 2020-10-17 DIAGNOSIS — Z13820 Encounter for screening for osteoporosis: Secondary | ICD-10-CM

## 2021-02-11 ENCOUNTER — Encounter: Payer: Self-pay | Admitting: Family Medicine

## 2021-02-11 ENCOUNTER — Other Ambulatory Visit: Payer: Self-pay

## 2021-02-11 ENCOUNTER — Ambulatory Visit (INDEPENDENT_AMBULATORY_CARE_PROVIDER_SITE_OTHER): Payer: Medicare (Managed Care) | Admitting: Family Medicine

## 2021-02-11 VITALS — BP 142/97 | HR 101 | Ht 64.0 in

## 2021-02-11 DIAGNOSIS — F32A Depression, unspecified: Secondary | ICD-10-CM

## 2021-02-11 DIAGNOSIS — R269 Unspecified abnormalities of gait and mobility: Secondary | ICD-10-CM | POA: Diagnosis not present

## 2021-02-11 DIAGNOSIS — G35 Multiple sclerosis: Secondary | ICD-10-CM | POA: Diagnosis not present

## 2021-02-11 DIAGNOSIS — Z79899 Other long term (current) drug therapy: Secondary | ICD-10-CM

## 2021-02-11 DIAGNOSIS — F419 Anxiety disorder, unspecified: Secondary | ICD-10-CM

## 2021-02-11 DIAGNOSIS — R569 Unspecified convulsions: Secondary | ICD-10-CM

## 2021-02-11 DIAGNOSIS — F09 Unspecified mental disorder due to known physiological condition: Secondary | ICD-10-CM
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
## 2021-02-11 NOTE — Patient Instructions (Signed)
Below is our plan:  We will continue Arava 10mg  daily for MS management. We will continue divalproex ER 750mg  daily. Please consider PT for strength and gait training. Please keep a close eye on her blood pressure.   Please make sure you are staying well hydrated. I recommend 50-60 ounces daily. Well balanced diet and regular exercise encouraged. Consistent sleep schedule with 6-8 hours recommended.   Please continue follow up with care team as directed.   Follow up with Dr in 6 months   You may receive a survey regarding today's visit. I encourage you to leave honest feed back as I do use this information to improve patient care. Thank you for seeing me today!

## 2021-08-12 ENCOUNTER — Ambulatory Visit (INDEPENDENT_AMBULATORY_CARE_PROVIDER_SITE_OTHER): Payer: Medicare (Managed Care) | Admitting: Neurology

## 2021-08-12 ENCOUNTER — Encounter: Payer: Self-pay | Admitting: Neurology

## 2021-08-12 VITALS — BP 117/86 | HR 108

## 2021-08-12 DIAGNOSIS — R269 Unspecified abnormalities of gait and mobility: Secondary | ICD-10-CM | POA: Diagnosis not present

## 2021-08-12 DIAGNOSIS — Z79899 Other long term (current) drug therapy: Secondary | ICD-10-CM | POA: Diagnosis not present

## 2021-08-12 DIAGNOSIS — R569 Unspecified convulsions: Secondary | ICD-10-CM | POA: Diagnosis not present

## 2021-08-12 DIAGNOSIS — G35 Multiple sclerosis: Secondary | ICD-10-CM | POA: Diagnosis not present

## 2021-08-12 DIAGNOSIS — F419 Anxiety disorder, unspecified: Secondary | ICD-10-CM

## 2021-08-12 DIAGNOSIS — F32A Depression, unspecified: Secondary | ICD-10-CM

## 2021-08-12 NOTE — Progress Notes (Signed)
Faxed letter asking to d/c Wellbutrin to [facility] at [phone] and [facility] at [phone]. Received fax confirmation for both. ?
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
## 2021-08-13 ENCOUNTER — Telehealth: Payer: Self-pay | Admitting: Neurology

## 2021-08-13 LAB — COMPREHENSIVE METABOLIC PANEL
ALT: 6 IU/L (ref 0–32)
AST: 9 IU/L (ref 0–40)
Albumin/Globulin Ratio: 2 (ref 1.2–2.2)
Albumin: 4.6 g/dL (ref 3.8–4.8)
Alkaline Phosphatase: 85 IU/L (ref 44–121)
BUN/Creatinine Ratio: 20 (ref 12–28)
BUN: 32 mg/dL — ABNORMAL HIGH (ref 8–27)
Bilirubin Total: <0.2 mg/dL (ref 0.0–1.2)
CO2: 22 mmol/L (ref 20–29)
Calcium: 10.2 mg/dL (ref 8.7–10.3)
Chloride: 95 mmol/L — ABNORMAL LOW (ref 96–106)
Creatinine, Ser: 1.6 mg/dL — ABNORMAL HIGH (ref 0.57–1.00)
Globulin, Total: 2.3 g/dL (ref 1.5–4.5)
Glucose: 101 mg/dL — ABNORMAL HIGH (ref 70–99)
Potassium: 4.4 mmol/L (ref 3.5–5.2)
Sodium: 140 mmol/L (ref 134–144)
Total Protein: 6.9 g/dL (ref 6.0–8.5)
eGFR: 36 mL/min/{1.73_m2} — ABNORMAL LOW (ref >59)

## 2021-08-13 LAB — CBC WITH DIFFERENTIAL/PLATELET
Basophils Absolute: 0.1 10*3/uL (ref 0.0–0.2)
Basos: 1 %
EOS (ABSOLUTE): 0.3 10*3/uL (ref 0.0–0.4)
Eos: 4 %
Hematocrit: 40.9 % (ref 34.0–46.6)
Hemoglobin: 13.1 g/dL (ref 11.1–15.9)
Immature Grans (Abs): 0 10*3/uL (ref 0.0–0.1)
Immature Granulocytes: 0 %
Lymphocytes Absolute: 2.5 10*3/uL (ref 0.7–3.1)
Lymphs: 37 %
MCH: 30.2 pg (ref 26.6–33.0)
MCHC: 32 g/dL (ref 31.5–35.7)
MCV: 94 fL (ref 79–97)
Monocytes Absolute: 0.6 10*3/uL (ref 0.1–0.9)
Monocytes: 9 %
Neutrophils Absolute: 3.4 10*3/uL (ref 1.4–7.0)
Neutrophils: 49 %
Platelets: 239 10*3/uL (ref 150–450)
RBC: 4.34 x10E6/uL (ref 3.77–5.28)
RDW: 14.2 % (ref 11.7–15.4)
WBC: 6.8 10*3/uL (ref 3.4–10.8)

## 2021-08-13 LAB — VALPROIC ACID LEVEL: Valproic Acid Lvl: 62 ug/mL (ref 50–100)

## 2021-08-13 NOTE — Telephone Encounter (Signed)
Called the pt sister and reviewed the lab results with her. Advised the kidney function is not working as well as they should but in comparing to previous labs that has been a consistent trend. Dr Epimenio Foot recommends that she is staying well hydrated. Advised that we will send this information to [facility] so they can continue to monitor. She verbalized understanding and had no other questions  ?

## 2021-08-13 NOTE — Telephone Encounter (Signed)
-----   Message from Asa Lente, MD sent at 08/13/2021 10:05 AM EDT ----- ?Please let the family know that the Depakote level and the blood counts look good.  The chemistries show that her kidneys are not working as well as they should.  It is possible she was just dehydrated when the lab work was collected but I would like the lab work to be forwarded to her primary care practice ([facility]) in case further evaluation is necessary ?

## 2021-08-15 ENCOUNTER — Telehealth: Payer: Self-pay | Admitting: Neurology

## 2021-08-15 NOTE — Telephone Encounter (Signed)
Tonye Becket, NP with [facility] is asking for a call back on Monday to confirm if pt is to f/u in 6 mo or if needs to be for 1 yr f/u as scheduled, also she is asking for a call to discuss pt's divalproex (DEPAKOTE ER) 250 MG 24 hr tablet ?

## 2021-08-18 NOTE — Addendum Note (Signed)
Addended by: Arther Abbott on: 08/18/2021 09:19 AM ? ? Modules accepted: Orders ? ?

## 2021-08-18 NOTE — Telephone Encounter (Addendum)
Called back and spoke w/ [place]. Relayed per Dr. Epimenio Foot, ok to keep yearly f/u already scheduled for 08/16/21 at 3pm. ? ?She also wanted to report, pt had trouble swallowing depakote ER 750mg  capsule. They changed her to depakote sprinkle 375mg  po BID. She has been tolerating this well. I updated med list. ?

## 2021-09-02 ENCOUNTER — Telehealth: Payer: Self-pay | Admitting: Adult Health

## 2021-09-02 NOTE — Telephone Encounter (Signed)
Nurse Leavy Cella called from [facility] last evening to report this resident has a swollen lower lip. She is not wheezing. Lungs were auscultated and per nurse are WNL. Vitals pulse 95 sat 96% temp 97.5 bp 143/107 RR 16. She is reported to be in no acute distress. No new meds or exposures reported. Unclear if this is an allergic reaction. Recommend 25 mg of Benadryl IM. F/U vitals ordered along with continued monitoring. The nurse called [facility] but this is not our pt and is a pt of [facility].  ?

## 2021-10-03 ENCOUNTER — Other Ambulatory Visit: Payer: Self-pay | Admitting: Family Medicine

## 2021-10-03 DIAGNOSIS — Z1231 Encounter for screening mammogram for malignant neoplasm of breast: Secondary | ICD-10-CM

## 2021-10-21 ENCOUNTER — Ambulatory Visit
Admission: RE | Admit: 2021-10-21 | Discharge: 2021-10-21 | Disposition: A | Discharge status: Home / Self Care | Payer: Medicare (Managed Care) | Source: Ambulatory Visit | Attending: Family Medicine | Admitting: Family Medicine

## 2021-10-21 ENCOUNTER — Ambulatory Visit: Payer: Medicare (Managed Care)

## 2021-10-21 DIAGNOSIS — Z1231 Encounter for screening mammogram for malignant neoplasm of breast: Secondary | ICD-10-CM

## 2021-10-24 ENCOUNTER — Other Ambulatory Visit: Payer: Self-pay | Admitting: Vascular Surgery

## 2021-10-24 ENCOUNTER — Other Ambulatory Visit: Payer: Self-pay | Admitting: Family Medicine

## 2021-10-24 DIAGNOSIS — Z1231 Encounter for screening mammogram for malignant neoplasm of breast: Secondary | ICD-10-CM
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
## 2022-08-17 ENCOUNTER — Ambulatory Visit: Payer: Medicare (Managed Care) | Admitting: Family Medicine

## 2022-08-17 DIAGNOSIS — G35 Multiple sclerosis: Secondary | ICD-10-CM

## 2022-08-17 DIAGNOSIS — R269 Unspecified abnormalities of gait and mobility: Secondary | ICD-10-CM

## 2022-08-17 DIAGNOSIS — F32A Depression, unspecified: Secondary | ICD-10-CM

## 2022-08-17 DIAGNOSIS — R569 Unspecified convulsions: Secondary | ICD-10-CM

## 2022-08-17 DIAGNOSIS — F09 Unspecified mental disorder due to known physiological condition: Secondary | ICD-10-CM

## 2022-08-17 DIAGNOSIS — Z79899 Other long term (current) drug therapy: Secondary | ICD-10-CM

## 2022-08-21 ENCOUNTER — Emergency Department (HOSPITAL_COMMUNITY): Payer: Medicare (Managed Care)

## 2022-08-21 ENCOUNTER — Encounter (HOSPITAL_COMMUNITY): Payer: Self-pay

## 2022-08-21 ENCOUNTER — Other Ambulatory Visit: Payer: Self-pay

## 2022-08-21 ENCOUNTER — Emergency Department (HOSPITAL_COMMUNITY)
Admission: EM | Admit: 2022-08-21 | Discharge: 2022-08-21 | Disposition: A | Discharge status: Home / Self Care | Payer: Medicare (Managed Care) | Attending: Emergency Medicine | Admitting: Emergency Medicine

## 2022-08-21 DIAGNOSIS — Z7982 Long term (current) use of aspirin: Secondary | ICD-10-CM | POA: Diagnosis not present

## 2022-08-21 DIAGNOSIS — F0390 Unspecified dementia without behavioral disturbance: Secondary | ICD-10-CM | POA: Insufficient documentation

## 2022-08-21 DIAGNOSIS — I10 Essential (primary) hypertension: Secondary | ICD-10-CM | POA: Insufficient documentation

## 2022-08-21 DIAGNOSIS — Z79899 Other long term (current) drug therapy: Secondary | ICD-10-CM | POA: Insufficient documentation

## 2022-08-21 DIAGNOSIS — Z1152 Encounter for screening for COVID-19: Secondary | ICD-10-CM | POA: Insufficient documentation

## 2022-08-21 DIAGNOSIS — R109 Unspecified abdominal pain: Secondary | ICD-10-CM | POA: Insufficient documentation

## 2022-08-21 DIAGNOSIS — R079 Chest pain, unspecified: Secondary | ICD-10-CM | POA: Diagnosis not present

## 2022-08-21 DIAGNOSIS — J189 Pneumonia, unspecified organism: Secondary | ICD-10-CM | POA: Diagnosis not present

## 2022-08-21 DIAGNOSIS — R059 Cough, unspecified: Secondary | ICD-10-CM | POA: Diagnosis present

## 2022-08-21 LAB — RESP PANEL BY RT-PCR (RSV, FLU A&B, COVID)  RVPGX2
Influenza A by PCR: NEGATIVE
Influenza B by PCR: NEGATIVE
Resp Syncytial Virus by PCR: NEGATIVE
SARS Coronavirus 2 by RT PCR: NEGATIVE

## 2022-08-21 LAB — BASIC METABOLIC PANEL
Anion gap: 10 (ref 5–15)
BUN: 21 mg/dL (ref 8–23)
CO2: 24 mmol/L (ref 22–32)
Calcium: 9.2 mg/dL (ref 8.9–10.3)
Chloride: 107 mmol/L (ref 98–111)
Creatinine, Ser: 1.54 mg/dL — ABNORMAL HIGH (ref 0.44–1.00)
GFR, Estimated: 38 mL/min — ABNORMAL LOW (ref >60)
Glucose, Bld: 92 mg/dL (ref 70–99)
Potassium: 4.6 mmol/L (ref 3.5–5.1)
Sodium: 141 mmol/L (ref 135–145)

## 2022-08-21 LAB — CBC WITH DIFFERENTIAL/PLATELET
Abs Immature Granulocytes: 0.04 10*3/uL (ref 0.00–0.07)
Basophils Absolute: 0.1 10*3/uL (ref 0.0–0.1)
Basophils Relative: 1 %
Eosinophils Absolute: 0.3 10*3/uL (ref 0.0–0.5)
Eosinophils Relative: 4 %
HCT: 41 % (ref 36.0–46.0)
Hemoglobin: 12.5 g/dL (ref 12.0–15.0)
Immature Granulocytes: 1 %
Lymphocytes Relative: 38 %
Lymphs Abs: 2.8 10*3/uL (ref 0.7–4.0)
MCH: 29.7 pg (ref 26.0–34.0)
MCHC: 30.5 g/dL (ref 30.0–36.0)
MCV: 97.4 fL (ref 80.0–100.0)
Monocytes Absolute: 0.6 10*3/uL (ref 0.1–1.0)
Monocytes Relative: 8 %
Neutro Abs: 3.6 10*3/uL (ref 1.7–7.7)
Neutrophils Relative %: 48 %
Platelets: 304 10*3/uL (ref 150–400)
RBC: 4.21 MIL/uL (ref 3.87–5.11)
RDW: 14.9 % (ref 11.5–15.5)
WBC: 7.5 10*3/uL (ref 4.0–10.5)
nRBC: 0 % (ref 0.0–0.2)

## 2022-08-21 LAB — TROPONIN I (HIGH SENSITIVITY)
Troponin I (High Sensitivity): 9 ng/L (ref <18)
Troponin I (High Sensitivity): 9 ng/L (ref <18)

## 2022-08-21 LAB — BRAIN NATRIURETIC PEPTIDE: B Natriuretic Peptide: 27.8 pg/mL (ref 0.0–100.0)

## 2022-08-21 MED ORDER — DOXYCYCLINE HYCLATE 100 MG PO CAPS: 100.0000 mg | ORAL_CAPSULE | Freq: Two times a day (BID) | ORAL | 0 refills | Status: DC

## 2022-08-21 MED ORDER — LOSARTAN POTASSIUM 50 MG PO TABS
100.0000 mg | ORAL_TABLET | Freq: Every day | ORAL | Status: DC
  Administered 2022-08-21: 100 mg via ORAL
  Filled 2022-08-21: qty 2

## 2022-08-21 MED ORDER — DOXYCYCLINE HYCLATE 100 MG PO CAPS: 100.0000 mg | ORAL_CAPSULE | Freq: Two times a day (BID) | ORAL | 0 refills | Status: AC

## 2022-08-21 MED ORDER — SODIUM CHLORIDE 0.9 % IV BOLUS
500.0000 mL | Freq: Once | INTRAVENOUS | Status: AC
  Administered 2022-08-21: 500 mL via INTRAVENOUS

## 2022-08-21 MED ORDER — DOXYCYCLINE HYCLATE 100 MG PO TABS
100.0000 mg | ORAL_TABLET | Freq: Once | ORAL | Status: AC
  Administered 2022-08-21: 100 mg via ORAL
  Filled 2022-08-21: qty 1

## 2022-08-21 NOTE — ED Notes (Signed)
Fall risk bracelet placed on pt's right wrist

## 2022-08-21 NOTE — ED Triage Notes (Signed)
Pt BIB EMS from [facility]. Per EMS, pt started having right sided chest pain @ 0600 this morning. Pain is sharp, 10/10, and does not radiate. Pt normally on 2L [place]. Upon EMS arrival, pt O2 sats at 88% on 3L [place]. Pt has productive cough. Pt has dementia at baseline. Current alert.

## 2022-08-21 NOTE — ED Provider Notes (Signed)
[facility] Provider Note   CSN: [id number] Arrival date & time: 08/21/22  0730     History  Chief Complaint  Patient presents with   Chest Pain    Christy Joseph is a 62 y.o. female.  Patient brought in by EMS from facility with cough and chest pain.  Seems to have may be right-sided chest pain that is reproducible but has had a cough.  She can tell me her name.  She is not having any major discomfort now.  No nausea or vomiting.  She is on 2 to 3 L of oxygen chronically.  She has a history of MS and dementia,stroke.  Does not sound like she has had any fevers or chills.  She is currently awake and alert.  The history is provided by the patient and the EMS personnel.       Home Medications Prior to Admission medications   Medication Sig Start Date End Date Taking? Authorizing Provider  acetaminophen (TYLENOL) 500 MG tablet Take 1,000 mg by mouth every morning.    [provider]  alendronate (FOSAMAX) 70 MG/75ML solution Take 70 mg by mouth every 7 (seven) days. Take with a full glass of water on an empty stomach.    [provider]  ASPIRIN 81 PO Take 1 tablet by mouth daily.    [provider]  buPROPion (WELLBUTRIN) 100 MG tablet Take 100 mg by mouth daily.    [provider]  calcitonin, salmon, (MIACALCIN/FORTICAL) 200 UNIT/ACT nasal spray Place 1 spray into alternate nostrils daily.    [provider]  Calcium Carb-Cholecalciferol (CALCIUM + D3) 600-200 MG-UNIT TABS Take 1 tablet by mouth daily. 11/05/15   Dettinger, Fransisca Kaufmann, MD  cholecalciferol (VITAMIN D3) 25 MCG (1000 UT) tablet Take 1,000 Units by mouth daily.    [provider]  Divalproex Sodium (DEPAKOTE SPRINKLES PO) Take 375 mg by mouth 2 (two) times daily.    [provider]  doxycycline (VIBRAMYCIN) 100 MG capsule Take 1 capsule (100 mg total) by mouth 2 (two) times daily for 10 days. 08/21/22 08/31/22   Ganon Demasi, DO  famotidine (PEPCID) 20 MG tablet Take 20 mg by mouth daily.    [provider]  gabapentin (NEURONTIN) 300 MG capsule Take 300 mg by mouth 2 (two) times daily.    [provider]  leflunomide (ARAVA) 10 MG tablet Take 10 mg daily Patient taking differently: Take 10 mg by mouth daily. 01/30/19   Sater, Nanine Means, MD  losartan (COZAAR) 100 MG tablet Take 100 mg by mouth daily.    [provider]  Menthol, Topical Analgesic, (BIOFREEZE EX) Apply 1 application topically 3 (three) times daily as needed. Apply to affected area(s) on back three times a day as needed for pain    [provider]  methocarbamol (ROBAXIN) 500 MG tablet Take 500 mg by mouth 3 (three) times daily.    [provider]  Nutritional Supplements (ENSURE ENLIVE PO) Take 8 oz by mouth 2 (two) times daily.    [provider]  OXYGEN Inhale 2 L into the lungs as needed.    [provider]  Polyethyl Glycol-Propyl Glycol (SYSTANE) 0.4-0.3 % SOLN Place 1 drop into both eyes daily.    [provider]  rosuvastatin (CRESTOR) 5 MG tablet Take 1 tablet (5 mg total) by mouth daily. 01/03/18   Dettinger, Fransisca Kaufmann, MD  senna-docusate (SENOKOT-S) 8.6-50 MG tablet Take 2 tablets  by mouth at bedtime as needed for mild constipation.    [provider]  sertraline (ZOLOFT) 100 MG tablet Take 1.5 tablets (150 mg total) by mouth daily. 02/10/18   Dettinger, Fransisca Kaufmann, MD  Thiamine HCl (VITAMIN B-1 PO) Take 100 mg by mouth daily.    [provider]  vitamin B-12 (CYANOCOBALAMIN) 1000 MCG tablet Take 1,000 mcg by mouth daily.    [provider]      Allergies    Patient has no known allergies.    Review of Systems   Review of Systems  Physical Exam Updated Vital Signs BP (!) 140/114   Pulse 97   Temp 97.7 F (36.5 C) (Oral)   Resp 20   SpO2 95%  Physical Exam Vitals and nursing note reviewed.  Constitutional:      General:  She is not in acute distress.    Appearance: She is well-developed. She is not ill-appearing.  HENT:     Head: Normocephalic and atraumatic.  Eyes:     Extraocular Movements: Extraocular movements intact.     Conjunctiva/sclera: Conjunctivae normal.     Pupils: Pupils are equal, round, and reactive to light.  Cardiovascular:     Rate and Rhythm: Normal rate and regular rhythm.     Pulses:          Radial pulses are 2+ on the right side and 2+ on the left side.     Heart sounds: Normal heart sounds. No murmur heard. Pulmonary:     Effort: Pulmonary effort is normal. No respiratory distress.     Breath sounds: Normal breath sounds. No decreased breath sounds, wheezing or rhonchi.  Abdominal:     Palpations: Abdomen is soft.     Tenderness: There is abdominal tenderness.  Musculoskeletal:        General: No swelling.     Cervical back: Normal range of motion and neck supple.  Skin:    General: Skin is warm and dry.     Capillary Refill: Capillary refill takes less than 2 seconds.  Neurological:     General: No focal deficit present.     Mental Status: She is alert.  Psychiatric:        Mood and Affect: Mood normal.     ED Results / Procedures / Treatments   Labs (all labs ordered are listed, but only abnormal results are displayed) Labs Reviewed  BASIC METABOLIC PANEL - Abnormal; Notable for the following components:      Result Value   Creatinine, Ser 1.54 (*)    GFR, Estimated 38 (*)    All other components within normal limits  RESP PANEL BY RT-PCR (RSV, FLU A&B, COVID)  [id number]  CBC WITH DIFFERENTIAL/PLATELET  BRAIN NATRIURETIC PEPTIDE  TROPONIN I (HIGH SENSITIVITY)  TROPONIN I (HIGH SENSITIVITY)    EKG EKG Interpretation  Date/Time:  Friday August 21 2022 07:41:08 EDT Ventricular Rate:  87 PR Interval:  155 QRS Duration: 79 QT Interval:  364 QTC Calculation: 438 R Axis:   53 Text Interpretation: Sinus rhythm Abnrm T, consider ischemia, anterolateral lds  Confirmed by Lennice Sites ([id number]) on 08/21/2022 7:43:40 AM  Radiology DG Chest Portable 1 View  Result Date: 08/21/2022 CLINICAL DATA:  Chest pain EXAM: PORTABLE CHEST 1 VIEW COMPARISON:  Portable exam 0801 hours compared to 02/04/2009 FINDINGS: Normal heart size, mediastinal contours, and pulmonary vascularity. Atherosclerotic calcification aorta. Elevation RIGHT diaphragm new since previous exam with mild RIGHT basilar atelectasis. Hazy interstitial infiltrate mid to  lower LEFT lung question pneumonia. No pleural effusion or pneumothorax. IMPRESSION: Elevated RIGHT diaphragm with RIGHT basilar atelectasis. Hazy interstitial infiltrate mid inferior LEFT lung question pneumonia, including atypical etiologies. Aortic Atherosclerosis (ICD10-I70.0). Electronically Signed   By: Lavonia Dana M.D.   On: 08/21/2022 08:30    Procedures Procedures    Medications Ordered in ED Medications  doxycycline (VIBRA-TABS) tablet 100 mg (100 mg Oral Given 08/21/22 0934)  sodium chloride 0.9 % bolus 500 mL (0 mLs Intravenous Stopped 08/21/22 1044)    ED Course/ Medical Decision Making/ A&P                             Medical Decision Making Amount and/or Complexity of Data Reviewed Labs: ordered. Radiology: ordered.  Risk Prescription drug management.   Shariece B Hardbarger is here with chest pain.  History of MS, hypertension, stroke.  Differential is broad as history somewhat limited.  EKG shows sinus rhythm.  Some nonspecific T wave changes throughout.  These are seen on some prior EKGs.  She seems to have some reproducible tenderness on exam.  However differential includes ACS, pneumonia, MSK pain, acid reflux does not seem likely to be dissection or other acute process.  She has clear breath sounds.  She is on her room air oxygen.  Will get CBC, BMP, troponin, chest x-ray, COVID and flu testing.  Per my review and interpretation labs no significant anemia or electrolyte abnormality or kidney injury.  COVID  and flu test negative.  Chest x-ray may be with pneumonia.  Will treat with antibiotics.  Troponin negative x 2.  No concern for ACS or sepsis or other acute process.  Discharged in good condition.  This chart was dictated using voice recognition software.  Despite best efforts to proofread,  errors can occur which can change the documentation meaning.         Final Clinical Impression(s) / ED Diagnoses Final diagnoses:  Community acquired pneumonia, unspecified laterality    Rx / DC Orders ED Discharge Orders          Ordered    doxycycline (VIBRAMYCIN) 100 MG capsule  2 times daily,   Status:  Discontinued        08/21/22 1334    doxycycline (VIBRAMYCIN) 100 MG capsule  2 times daily        08/21/22 [place], Bennett, DO 08/21/22 1343

## 2022-08-21 NOTE — ED Notes (Signed)
[facility] called for transport.  

## 2022-08-21 NOTE — ED Notes (Signed)
Called lab to inquire about pending troponin. Lab technician states one of the machines is down at this time and is currently being repaired. MD made aware.

## 2022-08-21 NOTE — ED Notes (Signed)
Gave report to [place] , Therapist, sports from [place]

## 2022-08-25 ENCOUNTER — Other Ambulatory Visit: Payer: Self-pay

## 2022-08-25 ENCOUNTER — Emergency Department (HOSPITAL_COMMUNITY)
Admission: EM | Admit: 2022-08-25 | Discharge: 2022-08-26 | Disposition: A | Discharge status: Skilled Nursing Facility | Payer: Medicare (Managed Care) | Attending: Emergency Medicine | Admitting: Emergency Medicine

## 2022-08-25 ENCOUNTER — Encounter (HOSPITAL_COMMUNITY): Payer: Self-pay | Admitting: Radiology

## 2022-08-25 ENCOUNTER — Emergency Department (HOSPITAL_COMMUNITY): Payer: Medicare (Managed Care)

## 2022-08-25 DIAGNOSIS — R000 Tachycardia, unspecified: Secondary | ICD-10-CM | POA: Insufficient documentation

## 2022-08-25 DIAGNOSIS — R079 Chest pain, unspecified: Secondary | ICD-10-CM

## 2022-08-25 DIAGNOSIS — Z7982 Long term (current) use of aspirin: Secondary | ICD-10-CM | POA: Diagnosis not present

## 2022-08-25 LAB — TROPONIN I (HIGH SENSITIVITY)
Troponin I (High Sensitivity): 16 ng/L (ref <18)
Troponin I (High Sensitivity): 19 ng/L — ABNORMAL HIGH (ref <18)

## 2022-08-25 LAB — I-STAT CHEM 8, ED
BUN: 33 mg/dL — ABNORMAL HIGH (ref 8–23)
Calcium, Ion: 1.11 mmol/L — ABNORMAL LOW (ref 1.15–1.40)
Chloride: 107 mmol/L (ref 98–111)
Creatinine, Ser: 1.2 mg/dL — ABNORMAL HIGH (ref 0.44–1.00)
Glucose, Bld: 95 mg/dL (ref 70–99)
HCT: 46 % (ref 36.0–46.0)
Hemoglobin: 15.6 g/dL — ABNORMAL HIGH (ref 12.0–15.0)
Potassium: 5.3 mmol/L — ABNORMAL HIGH (ref 3.5–5.1)
Sodium: 141 mmol/L (ref 135–145)
TCO2: 27 mmol/L (ref 22–32)

## 2022-08-25 LAB — CBC
HCT: 50.5 % — ABNORMAL HIGH (ref 36.0–46.0)
Hemoglobin: 15.4 g/dL — ABNORMAL HIGH (ref 12.0–15.0)
MCH: 29.2 pg (ref 26.0–34.0)
MCHC: 30.5 g/dL (ref 30.0–36.0)
MCV: 95.8 fL (ref 80.0–100.0)
Platelets: 375 10*3/uL (ref 150–400)
RBC: 5.27 MIL/uL — ABNORMAL HIGH (ref 3.87–5.11)
RDW: 14.7 % (ref 11.5–15.5)
WBC: 6.7 10*3/uL (ref 4.0–10.5)
nRBC: 0 % (ref 0.0–0.2)

## 2022-08-25 MED ORDER — DICLOFENAC SODIUM 1 % EX GEL: 4.0000 g | Freq: Four times a day (QID) | CUTANEOUS | 0 refills | Status: AC

## 2022-08-25 NOTE — ED Provider Notes (Signed)
[facility] Provider Note   CSN: [id number] Arrival date & time: 08/25/22  2037     History  Chief Complaint  Patient presents with   Chest Pain    Christy Joseph is a 62 y.o. female.  62 yo F with a chief complaints of chest pain.  This is sternal or just left of the sternal border and lasted for about 45 minutes.  Occurred while at rest.  Nothing seems to make it better or worse.  She feels like it is resolved at this point.  She was recently treated for pneumonia.  She thinks that that is better.  Denies history of MI.  Denies history of PE or DVT.   Chest Pain      Home Medications Prior to Admission medications   Medication Sig Start Date End Date Taking? Authorizing Provider  diclofenac Sodium (VOLTAREN) 1 % GEL Apply 4 g topically 4 (four) times daily. 08/25/22  Yes Deno Etienne, DO  acetaminophen (TYLENOL) 500 MG tablet Take 1,000 mg by mouth every morning.    [provider]  alendronate (FOSAMAX) 70 MG/75ML solution Take 70 mg by mouth every 7 (seven) days. Take with a full glass of water on an empty stomach.    [provider]  ASPIRIN 81 PO Take 1 tablet by mouth daily.    [provider]  buPROPion (WELLBUTRIN) 100 MG tablet Take 100 mg by mouth daily.    [provider]  calcitonin, salmon, (MIACALCIN/FORTICAL) 200 UNIT/ACT nasal spray Place 1 spray into alternate nostrils daily.    [provider]  Calcium Carb-Cholecalciferol (CALCIUM + D3) 600-200 MG-UNIT TABS Take 1 tablet by mouth daily. 11/05/15   Dettinger, Fransisca Kaufmann, MD  cholecalciferol (VITAMIN D3) 25 MCG (1000 UT) tablet Take 1,000 Units by mouth daily.    [provider]  Divalproex Sodium (DEPAKOTE SPRINKLES PO) Take 375 mg by mouth 2 (two) times daily.    [provider]  doxycycline (VIBRAMYCIN) 100 MG capsule Take 1 capsule (100 mg total) by mouth 2 (two) times daily for 10 days. 08/21/22 08/31/22   Curatolo, Adam, DO  famotidine (PEPCID) 20 MG tablet Take 20 mg by mouth daily.    [provider]  gabapentin (NEURONTIN) 300 MG capsule Take 300 mg by mouth 2 (two) times daily.    [provider]  leflunomide (ARAVA) 10 MG tablet Take 10 mg daily Patient taking differently: Take 10 mg by mouth daily. 01/30/19   Sater, Nanine Means, MD  losartan (COZAAR) 100 MG tablet Take 100 mg by mouth daily.    [provider]  Menthol, Topical Analgesic, (BIOFREEZE EX) Apply 1 application topically 3 (three) times daily as needed. Apply to affected area(s) on back three times a day as needed for pain    [provider]  methocarbamol (ROBAXIN) 500 MG tablet Take 500 mg by mouth 3 (three) times daily.    [provider]  Nutritional Supplements (ENSURE ENLIVE PO) Take 8 oz by mouth 2 (two) times daily.    [provider]  OXYGEN Inhale 2 L into the lungs as needed.    [provider]  Polyethyl Glycol-Propyl Glycol (SYSTANE) 0.4-0.3 % SOLN Place 1 drop into both eyes daily.    [provider]  rosuvastatin (CRESTOR) 5 MG tablet Take 1 tablet (5 mg total) by mouth daily. 01/03/18   Dettinger, Fransisca Kaufmann, MD  senna-docusate (SENOKOT-S) 8.6-50 MG tablet Take 2 tablets  by mouth at bedtime as needed for mild constipation.    [provider]  sertraline (ZOLOFT) 100 MG tablet Take 1.5 tablets (150 mg total) by mouth daily. 02/10/18   Dettinger, Fransisca Kaufmann, MD  Thiamine HCl (VITAMIN B-1 PO) Take 100 mg by mouth daily.    [provider]  vitamin B-12 (CYANOCOBALAMIN) 1000 MCG tablet Take 1,000 mcg by mouth daily.    [provider]      Allergies    Patient has no known allergies.    Review of Systems   Review of Systems  Cardiovascular:  Positive for chest pain.    Physical Exam Updated Vital Signs BP (!) 187/138   Pulse (!) 110   Temp 98.5 F (36.9 C) (Oral)   Resp 20   Ht 5\' 4"  (1.626 m)   Wt 48.1 kg   SpO2  96%   BMI 18.19 kg/m  Physical Exam Vitals and nursing note reviewed.  Constitutional:      General: She is not in acute distress.    Appearance: She is well-developed. She is not diaphoretic.  HENT:     Head: Normocephalic and atraumatic.  Eyes:     Pupils: Pupils are equal, round, and reactive to light.  Cardiovascular:     Rate and Rhythm: Normal rate and regular rhythm.     Heart sounds: No murmur heard.    No friction rub. No gallop.  Pulmonary:     Effort: Pulmonary effort is normal.     Breath sounds: No wheezing or rales.  Abdominal:     General: There is no distension.     Palpations: Abdomen is soft.     Tenderness: There is no abdominal tenderness.  Musculoskeletal:        General: No tenderness.     Cervical back: Normal range of motion and neck supple.  Skin:    General: Skin is warm and dry.  Neurological:     Mental Status: She is alert and oriented to person, place, and time.  Psychiatric:        Behavior: Behavior normal.     ED Results / Procedures / Treatments   Labs (all labs ordered are listed, but only abnormal results are displayed) Labs Reviewed  CBC - Abnormal; Notable for the following components:      Result Value   RBC 5.27 (*)    Hemoglobin 15.4 (*)    HCT 50.5 (*)    All other components within normal limits  I-STAT CHEM 8, ED - Abnormal; Notable for the following components:   Potassium 5.3 (*)    BUN 33 (*)    Creatinine, Ser 1.20 (*)    Calcium, Ion 1.11 (*)    Hemoglobin 15.6 (*)    All other components within normal limits  TROPONIN I (HIGH SENSITIVITY)  TROPONIN I (HIGH SENSITIVITY)    EKG EKG Interpretation  Date/Time:  Tuesday August 25 2022 20:40:15 EDT Ventricular Rate:  106 PR Interval:  152 QRS Duration: 77 QT Interval:  311 QTC Calculation: 413 R Axis:   55 Text Interpretation: Sinus tachycardia Probable anterior infarct, age indeterminate downsloping st segments in inferior and lateral leads seen on prior No  significant change since last tracing Confirmed by Deno Etienne [phone]) on 08/25/2022 9:10:16 PM  Radiology DG Chest 2 View  Result Date: 08/25/2022 CLINICAL DATA:  Chest pain. EXAM: CHEST - 2 VIEW COMPARISON:  08/21/2022. FINDINGS: The heart size and mediastinal contours are within normal limits. There  is elevation of the right diaphragm with a small to moderate right pleural effusion. Interstitial prominence is noted bilaterally. A small left pleural effusion is noted. No pneumothorax. Multiple compression deformities are noted in the thoracic spine, not well evaluated due to osteopenia. IMPRESSION: 1. Small to moderate right pleural effusion and small left pleural effusion. 2. Interstitial prominence bilaterally, possible edema or infiltrate. Electronically Signed   By: Brett Fairy M.D.   On: 08/25/2022 21:44    Procedures Procedures    Medications Ordered in ED Medications - No data to display  ED Course/ Medical Decision Making/ A&P                             Medical Decision Making Amount and/or Complexity of Data Reviewed Labs: ordered. Radiology: ordered.   62 yo F with a chief complaint of chest pain.  Lasted for about 30 minutes.  Reproducible on exam.  Most likely musculoskeletal by history and physical.  Was just on doxycycline for presumed pneumonia.  No cough or fever currently.  Will obtain a chest x-ray blood work.  Feel her symptoms are completely typical of pulmonary embolism and no further workup is warranted.  Troponin is negative.  Unfortunate the patient's metabolic panel had been hemolyzed.  Will attempt to obtain an i-STAT.  Istat without significant electrolyte abnormality.  Most likely musculoskeletal.  Do not feel would benefit from delta.  Will d/c home.  PCP follow up.   11:29 PM:  I have discussed the diagnosis/risks/treatment options with the patient.  Evaluation and diagnostic testing in the emergency department does not suggest an emergent condition  requiring admission or immediate intervention beyond what has been performed at this time.  They will follow up with PCP. We also discussed returning to the ED immediately if new or worsening sx occur. We discussed the sx which are most concerning (e.g., sudden worsening pain, fever, inability to tolerate by mouth) that necessitate immediate return. Medications administered to the patient during their visit and any new prescriptions provided to the patient are listed below.  Medications given during this visit Medications - No data to display   The patient appears reasonably screen and/or stabilized for discharge and I doubt any other medical condition or other [facility] requiring further screening, evaluation, or treatment in the ED at this time prior to discharge.          Final Clinical Impression(s) / ED Diagnoses Final diagnoses:  Nonspecific chest pain    Rx / DC Orders ED Discharge Orders          Ordered    diclofenac Sodium (VOLTAREN) 1 % GEL  4 times daily        08/25/22 Harper, Omer Monter, DO 08/25/22 2329

## 2022-08-25 NOTE — ED Triage Notes (Signed)
Pt BIB ems from [facility] assisted and rehab facility. Pt complaining of chest pain that started about 30 minutes ago. Pt just finish a course of abx for pneumonia. Pt endorses dry cough. Pt n 3L O2 at baseline.

## 2022-08-25 NOTE — Discharge Instructions (Addendum)
Use the gel as prescribed Also take tylenol 1000mg(2 extra strength) four times a day.    
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
## 2022-09-22 ENCOUNTER — Ambulatory Visit (INDEPENDENT_AMBULATORY_CARE_PROVIDER_SITE_OTHER): Payer: Medicare (Managed Care) | Admitting: Family Medicine

## 2022-09-22 ENCOUNTER — Encounter: Payer: Self-pay | Admitting: Family Medicine

## 2022-09-22 VITALS — BP 144/97 | HR 86

## 2022-09-22 DIAGNOSIS — G35 Multiple sclerosis: Secondary | ICD-10-CM | POA: Diagnosis not present

## 2022-09-22 DIAGNOSIS — R569 Unspecified convulsions: Secondary | ICD-10-CM | POA: Diagnosis not present

## 2022-09-22 DIAGNOSIS — F09 Unspecified mental disorder due to known physiological condition: Secondary | ICD-10-CM

## 2022-09-22 DIAGNOSIS — F419 Anxiety disorder, unspecified: Secondary | ICD-10-CM | POA: Diagnosis not present

## 2022-09-22 DIAGNOSIS — Z79899 Other long term (current) drug therapy: Secondary | ICD-10-CM | POA: Diagnosis not present

## 2022-09-22 DIAGNOSIS — F32A Depression, unspecified: Secondary | ICD-10-CM

## 2022-09-22 DIAGNOSIS — Z7409 Other reduced mobility: Secondary | ICD-10-CM

## 2022-09-22 NOTE — Patient Instructions (Signed)
Below is our plan:  We will continue Arava as prescribed. We could update imaging and or order PT but I am not sure how beneficial these would be due to her cognitive and physical deficits. Please continue discussion with PCP. Talk to [facility] as well.   Please make sure you are staying well hydrated. I recommend 50-60 ounces daily. Well balanced diet and regular exercise encouraged. Consistent sleep schedule with 6-8 hours recommended.   Please continue follow up with care team as directed.   Follow up with Dr Epimenio Foot in 6-12 months   You may receive a survey regarding today's visit. I encourage you to leave honest feed back as I do use this information to improve patient care. Thank you for seeing me today!   Management of Memory Problems   There are some general things you can do to help manage your memory problems.  Your memory may not in fact recover, but by using techniques and strategies you will be able to manage your memory difficulties better.   1)  Establish a routine. Try to establish and then stick to a regular routine.  By doing this, you will get used to what to expect and you will reduce the need to rely on your memory.  Also, try to do things at the same time of day, such as taking your medication or checking your calendar first thing in the morning. Think about think that you can do as a part of a regular routine and make a list.  Then enter them into a daily planner to remind you.  This will help you establish a routine.   2)  Organize your environment. Organize your environment so that it is uncluttered.  Decrease visual stimulation.  Place everyday items such as keys or cell phone in the same place every day (ie.  Basket next to front door) Use post it notes with a brief message to yourself (ie. Turn off light, lock the door) Use labels to indicate where things go (ie. Which cupboards are for food, dishes, etc.) Keep a notepad and pen by the telephone to take messages    3)  Memory Aids A diary or journal/notebook/daily planner Making a list (shopping list, chore list, to do list that needs to be done) Using an alarm as a reminder (kitchen timer or cell phone alarm) Using cell phone to store information (Notes, Calendar, Reminders) Calendar/White board placed in a prominent position Post-it notes   In order for memory aids to be useful, you need to have good habits.  It's no good remembering to make a note in your journal if you don't remember to look in it.  Try setting aside a certain time of day to look in journal.   4)  Improving mood and managing fatigue. There may be other factors that contribute to memory difficulties.  Factors, such as anxiety, depression and tiredness can affect memory. Regular gentle exercise can help improve your mood and give you more energy. Exercise: there are short videos created by the [employer] on Health specially for older adults: https://bit.ly/2I30q97.  Mediterranean diet: which emphasizes fruits, vegetables, whole grains, legumes, fish, and other seafood; unsaturated fats such as olive oils; and low amounts of red meat, eggs, and sweets. A variation of this, called MIND (Mediterranean-DASH Intervention for Neurodegenerative Delay) incorporates the DASH (Dietary Approaches to Stop Hypertension) diet, which has been shown to lower high blood pressure, a risk factor for Alzheimer's disease. More information at: ExitMarketing.de.  Aerobic  exercise that improve heart health is also good for the mind.  [employer] on Aging have short videos for exercises that you can do at home: BlindWorkshop.com.pt Simple relaxation techniques may help relieve symptoms of anxiety Try to get back to completing activities or hobbies you enjoyed doing in the past. Learn to pace yourself through activities to decrease fatigue. Find out about some local  support groups where you can share experiences with others. Try and achieve 7-8 hours of sleep at night.

## 2022-10-19 NOTE — Progress Notes (Signed)
ok 

## 2022-10-23 ENCOUNTER — Ambulatory Visit: Payer: Medicare (Managed Care)

## 2022-10-23 ENCOUNTER — Ambulatory Visit
Admission: RE | Admit: 2022-10-23 | Discharge: 2022-10-23 | Disposition: A | Discharge status: Home / Self Care | Payer: Medicare (Managed Care) | Source: Ambulatory Visit | Attending: Vascular Surgery | Admitting: Vascular Surgery

## 2022-10-23 ENCOUNTER — Other Ambulatory Visit: Payer: Self-pay | Admitting: Vascular Surgery

## 2022-10-23 DIAGNOSIS — Z1239 Encounter for other screening for malignant neoplasm of breast: Secondary | ICD-10-CM

## 2022-10-23 DIAGNOSIS — Z1231 Encounter for screening mammogram for malignant neoplasm of breast: Secondary | ICD-10-CM

## 2022-11-19 ENCOUNTER — Ambulatory Visit
Admission: RE | Admit: 2022-11-19 | Discharge: 2022-11-19 | Disposition: A | Discharge status: Home / Self Care | Payer: Medicare (Managed Care) | Source: Ambulatory Visit | Attending: Vascular Surgery | Admitting: Vascular Surgery

## 2022-11-19 DIAGNOSIS — Z1239 Encounter for other screening for malignant neoplasm of breast: Secondary | ICD-10-CM

## 2023-03-30 ENCOUNTER — Ambulatory Visit (INDEPENDENT_AMBULATORY_CARE_PROVIDER_SITE_OTHER): Payer: Medicare (Managed Care) | Admitting: Neurology

## 2023-03-30 ENCOUNTER — Encounter: Payer: Self-pay | Admitting: Neurology

## 2023-03-30 VITALS — BP 141/86 | HR 78

## 2023-03-30 DIAGNOSIS — G35D Multiple sclerosis, unspecified: Secondary | ICD-10-CM

## 2023-03-30 DIAGNOSIS — F419 Anxiety disorder, unspecified: Secondary | ICD-10-CM | POA: Diagnosis not present

## 2023-03-30 DIAGNOSIS — F32A Depression, unspecified: Secondary | ICD-10-CM

## 2023-03-30 DIAGNOSIS — R569 Unspecified convulsions: Secondary | ICD-10-CM | POA: Diagnosis not present

## 2023-03-30 DIAGNOSIS — G35 Multiple sclerosis: Secondary | ICD-10-CM | POA: Diagnosis not present

## 2023-03-30 DIAGNOSIS — F09 Unspecified mental disorder due to known physiological condition: Secondary | ICD-10-CM

## 2023-03-30 DIAGNOSIS — R269 Unspecified abnormalities of gait and mobility: Secondary | ICD-10-CM

## 2023-03-30 DIAGNOSIS — Z79899 Other long term (current) drug therapy: Secondary | ICD-10-CM | POA: Diagnosis not present
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
## 2023-03-31 LAB — CBC WITH DIFFERENTIAL/PLATELET
Basophils Absolute: 0.1 10*3/uL (ref 0.0–0.2)
Basos: 1 %
EOS (ABSOLUTE): 0.3 10*3/uL (ref 0.0–0.4)
Eos: 4 %
Hematocrit: 46.4 % (ref 34.0–46.6)
Hemoglobin: 14.9 g/dL (ref 11.1–15.9)
Immature Grans (Abs): 0 10*3/uL (ref 0.0–0.1)
Immature Granulocytes: 0 %
Lymphocytes Absolute: 3.3 10*3/uL — ABNORMAL HIGH (ref 0.7–3.1)
Lymphs: 54 %
MCH: 30.6 pg (ref 26.6–33.0)
MCHC: 32.1 g/dL (ref 31.5–35.7)
MCV: 95 fL (ref 79–97)
Monocytes Absolute: 0.4 10*3/uL (ref 0.1–0.9)
Monocytes: 7 %
Neutrophils Absolute: 2.1 10*3/uL (ref 1.4–7.0)
Neutrophils: 34 %
Platelets: 254 10*3/uL (ref 150–450)
RBC: 4.87 x10E6/uL (ref 3.77–5.28)
RDW: 13.7 % (ref 11.7–15.4)
WBC: 6.1 10*3/uL (ref 3.4–10.8)

## 2023-03-31 LAB — COMPREHENSIVE METABOLIC PANEL
ALT: 6 [IU]/L (ref 0–32)
AST: 11 [IU]/L (ref 0–40)
Albumin: 4.6 g/dL (ref 3.9–4.9)
Alkaline Phosphatase: 82 [IU]/L (ref 44–121)
BUN/Creatinine Ratio: 14 (ref 12–28)
BUN: 19 mg/dL (ref 8–27)
Bilirubin Total: <0.2 mg/dL (ref 0.0–1.2)
CO2: 21 mmol/L (ref 20–29)
Calcium: 10.1 mg/dL (ref 8.7–10.3)
Chloride: 104 mmol/L (ref 96–106)
Creatinine, Ser: 1.37 mg/dL — ABNORMAL HIGH (ref 0.57–1.00)
Globulin, Total: 2.3 g/dL (ref 1.5–4.5)
Glucose: 67 mg/dL — ABNORMAL LOW (ref 70–99)
Potassium: 4.2 mmol/L (ref 3.5–5.2)
Sodium: 144 mmol/L (ref 134–144)
Total Protein: 6.9 g/dL (ref 6.0–8.5)
eGFR: 44 mL/min/{1.73_m2} — ABNORMAL LOW (ref >59)

## 2023-03-31 LAB — VALPROIC ACID LEVEL: Valproic Acid Lvl: 61 ug/mL (ref 50–100)

## 2023-03-31 NOTE — Progress Notes (Signed)
Faxed signed order below to [facility]. Received fax confirmation.

## 2024-01-13 ENCOUNTER — Other Ambulatory Visit: Payer: Self-pay | Admitting: Internal Medicine

## 2024-01-13 DIAGNOSIS — Z1231 Encounter for screening mammogram for malignant neoplasm of breast: Secondary | ICD-10-CM

## 2024-01-14 ENCOUNTER — Telehealth: Payer: Self-pay

## 2024-01-14 NOTE — Telephone Encounter (Signed)
 Copied from CRM [id number]. Topic: Clinical - Prescription Issue >> Jan 14, 2024 11:33 AM Alfonso ORN wrote: Reason for CRM: sabrina - [facility]  need a refill and the provider name is Annabella Rase Call back # [phone]

## 2024-01-14 NOTE — Telephone Encounter (Signed)
Sent to wrong office location

## 2024-02-02 ENCOUNTER — Other Ambulatory Visit: Payer: Medicare (Managed Care)

## 2024-02-08 ENCOUNTER — Other Ambulatory Visit: Payer: Medicare (Managed Care)

## 2024-02-16 ENCOUNTER — Other Ambulatory Visit: Payer: Self-pay

## 2024-02-16 ENCOUNTER — Ambulatory Visit
Admission: RE | Admit: 2024-02-16 | Discharge: 2024-02-16 | Disposition: A | Discharge status: Home / Self Care | Payer: Medicare (Managed Care) | Source: Ambulatory Visit

## 2024-02-16 DIAGNOSIS — Z803 Family history of malignant neoplasm of breast: Secondary | ICD-10-CM

## 2024-02-16 DIAGNOSIS — M25551 Pain in right hip: Secondary | ICD-10-CM

## 2024-02-21 ENCOUNTER — Other Ambulatory Visit: Payer: Self-pay

## 2024-02-21 DIAGNOSIS — M25559 Pain in unspecified hip: Secondary | ICD-10-CM

## 2024-02-23 ENCOUNTER — Other Ambulatory Visit: Payer: Self-pay | Admitting: Internal Medicine

## 2024-02-23 ENCOUNTER — Ambulatory Visit
Admission: RE | Admit: 2024-02-23 | Discharge: 2024-02-23 | Disposition: A | Discharge status: Home / Self Care | Payer: Medicare (Managed Care) | Source: Ambulatory Visit | Attending: Internal Medicine | Admitting: Internal Medicine

## 2024-02-23 DIAGNOSIS — Z1231 Encounter for screening mammogram for malignant neoplasm of breast: Secondary | ICD-10-CM

## 2024-02-25 ENCOUNTER — Ambulatory Visit
Admission: RE | Admit: 2024-02-25 | Discharge: 2024-02-25 | Disposition: A | Discharge status: Home / Self Care | Payer: Medicare (Managed Care) | Source: Ambulatory Visit

## 2024-02-25 DIAGNOSIS — M25559 Pain in unspecified hip: Secondary | ICD-10-CM

## 2024-03-13 ENCOUNTER — Ambulatory Visit (INDEPENDENT_AMBULATORY_CARE_PROVIDER_SITE_OTHER): Payer: Medicare (Managed Care) | Admitting: Physician Assistant

## 2024-03-13 ENCOUNTER — Other Ambulatory Visit (INDEPENDENT_AMBULATORY_CARE_PROVIDER_SITE_OTHER): Payer: Medicare (Managed Care)

## 2024-03-13 DIAGNOSIS — R102 Pelvic and perineal pain unspecified side: Secondary | ICD-10-CM

## 2024-03-13 NOTE — Progress Notes (Signed)
 HPI: Patient 63 year old female comes in today with right hip pain.  Previous Dr. Barbarann patient.  Comes in with no new fall or injury.  Has a diagnosis of MS, seizure, stroke, gait disturbance, cognitive defects secondary to MS.  She is in a long-term care facility.  She comes in today with her sister.  She was having some hip pain and was sent by the facility to undergo a CT of her right hip.  No injury no known falls.  Sister notes that patient has not ambulated for greater than a year.  She has been on weightbearing since the CT scan which showed on 02/25/2024 age-indeterminate fractures of the right iliac crest, right anterior sacrum and the right inferior pubic ramus.  Transverse SI arthrodesis screw is present.  Significant demineralization of the bones noted throughout.  Review of systems please see HPI  Physical exam: General: Comfortable appearing female no acute distress. Bilateral hips good range of motion of both hips without significant pain.  No attempts of ambulation.  Radiographs:AP pelvis: Inferior rami fracture remains nondisplaced.  There is evidence of healing.  The superior iliac crest fracture and the sacral alla fracture is obscured secondary to bowel content.  No gross malalignment.  Transverse SI arthrodesis screw without evidence of hardware failure.  No other acute fractures.  Bilateral hips appear well-preserved.  Demineralization throughout the pelvis.   Impression: Right hip pain  Plan: Given the nondisplacement of the and inferior rami fracture on the right and age-indeterminate fractures of the right iliac crest and right anterior sacrum.  Recommend physical therapy for weight bearing activity as tolerated and lower extremity strengthening.  Currently she is on calcium  plus D3.  Could possibly benefit from other osteoporosis treatment.  She will follow-up with us  as needed.  Questions were encouraged and answered at length today the patient and her sister who was present  throughout the exam.
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
## 2024-03-28 NOTE — Patient Instructions (Incomplete)
 Below is our plan:  We will continue current treatment plan. We will update labs, today. Continue PT/OT through [facility]. Please monitor BP regularly. Elevated in the office.   Please make sure you are staying well hydrated. I recommend 50-60 ounces daily. Well balanced diet and regular exercise encouraged. Consistent sleep schedule with 6-8 hours recommended.   Please continue follow up with care team as directed.   Follow up with Dr Vear in 6 months  You may receive a survey regarding today's visit. I encourage you to leave honest feed back as I do use this information to improve patient care. Thank you for seeing me today!

## 2024-03-29 ENCOUNTER — Ambulatory Visit: Payer: Medicare (Managed Care) | Admitting: Family Medicine

## 2024-03-29 ENCOUNTER — Encounter: Payer: Self-pay | Admitting: Family Medicine

## 2024-03-29 VITALS — BP 165/100 | HR 86

## 2024-03-29 DIAGNOSIS — Z79899 Other long term (current) drug therapy: Secondary | ICD-10-CM | POA: Diagnosis not present

## 2024-03-29 DIAGNOSIS — F09 Unspecified mental disorder due to known physiological condition: Secondary | ICD-10-CM

## 2024-03-29 DIAGNOSIS — F419 Anxiety disorder, unspecified: Secondary | ICD-10-CM | POA: Diagnosis not present

## 2024-03-29 DIAGNOSIS — R569 Unspecified convulsions: Secondary | ICD-10-CM

## 2024-03-29 DIAGNOSIS — Z7409 Other reduced mobility: Secondary | ICD-10-CM

## 2024-03-29 DIAGNOSIS — G35D Multiple sclerosis, unspecified: Secondary | ICD-10-CM | POA: Diagnosis not present

## 2024-03-29 DIAGNOSIS — F32A Depression, unspecified: Secondary | ICD-10-CM

## 2024-03-29 DIAGNOSIS — R269 Unspecified abnormalities of gait and mobility: Secondary | ICD-10-CM

## 2024-03-30 ENCOUNTER — Ambulatory Visit: Payer: Self-pay | Admitting: Family Medicine

## 2024-03-30 LAB — COMPREHENSIVE METABOLIC PANEL WITH GFR
ALT: 5 IU/L (ref 0–32)
AST: 12 IU/L (ref 0–40)
Albumin: 4.3 g/dL (ref 3.9–4.9)
Alkaline Phosphatase: 96 IU/L (ref 49–135)
BUN/Creatinine Ratio: 20 (ref 12–28)
BUN: 27 mg/dL (ref 8–27)
Bilirubin Total: <0.2 mg/dL (ref 0.0–1.2)
CO2: 22 mmol/L (ref 20–29)
Calcium: 10 mg/dL (ref 8.7–10.3)
Chloride: 109 mmol/L — ABNORMAL HIGH (ref 96–106)
Creatinine, Ser: 1.38 mg/dL — ABNORMAL HIGH (ref 0.57–1.00)
Globulin, Total: 2.4 g/dL (ref 1.5–4.5)
Glucose: 76 mg/dL (ref 70–99)
Potassium: 5.1 mmol/L (ref 3.5–5.2)
Sodium: 146 mmol/L — ABNORMAL HIGH (ref 134–144)
Total Protein: 6.7 g/dL (ref 6.0–8.5)
eGFR: 43 mL/min/1.73 — ABNORMAL LOW (ref >59)

## 2024-03-30 LAB — CBC WITH DIFFERENTIAL/PLATELET
Basophils Absolute: 0.1 x10E3/uL (ref 0.0–0.2)
Basos: 2 %
EOS (ABSOLUTE): 0.2 x10E3/uL (ref 0.0–0.4)
Eos: 4 %
Hematocrit: 44.3 % (ref 34.0–46.6)
Hemoglobin: 13.5 g/dL (ref 11.1–15.9)
Immature Grans (Abs): 0 x10E3/uL (ref 0.0–0.1)
Immature Granulocytes: 0 %
Lymphocytes Absolute: 1.6 x10E3/uL (ref 0.7–3.1)
Lymphs: 35 %
MCH: 30.8 pg (ref 26.6–33.0)
MCHC: 30.5 g/dL — ABNORMAL LOW (ref 31.5–35.7)
MCV: 101 fL — ABNORMAL HIGH (ref 79–97)
Monocytes Absolute: 0.4 x10E3/uL (ref 0.1–0.9)
Monocytes: 9 %
Neutrophils Absolute: 2.2 x10E3/uL (ref 1.4–7.0)
Neutrophils: 50 %
Platelets: 181 x10E3/uL (ref 150–450)
RBC: 4.39 x10E6/uL (ref 3.77–5.28)
RDW: 13.4 % (ref 11.7–15.4)
WBC: 4.5 x10E3/uL (ref 3.4–10.8)

## 2024-03-30 LAB — VALPROIC ACID LEVEL: Valproic Acid Lvl: 65 ug/mL (ref 50–100)

## 2024-04-03 ENCOUNTER — Encounter: Payer: Self-pay | Admitting: Radiology

## 2024-04-06 ENCOUNTER — Telehealth: Payer: Self-pay | Admitting: Family Medicine

## 2024-04-06 NOTE — Telephone Encounter (Signed)
 Appointment details confirmed For [facility] for Oct of '2026

## 2024-07-10 ENCOUNTER — Ambulatory Visit: Payer: Medicare (Managed Care) | Admitting: "Endocrinology

## 2024-11-13 ENCOUNTER — Ambulatory Visit: Payer: Medicare (Managed Care) | Admitting: "Endocrinology

## 2025-03-29 ENCOUNTER — Ambulatory Visit: Payer: Medicare (Managed Care) | Admitting: Neurology
